# Patient Record
Sex: Female | Born: 1955
Health system: Southern US, Community
[De-identification: ages and names within clinical notes are randomized; demographics above are authoritative.]

## PROBLEM LIST (undated history)

## (undated) DIAGNOSIS — I10 Essential (primary) hypertension: Secondary | ICD-10-CM

## (undated) DIAGNOSIS — E785 Hyperlipidemia, unspecified: Secondary | ICD-10-CM

## (undated) DIAGNOSIS — G43909 Migraine, unspecified, not intractable, without status migrainosus: Secondary | ICD-10-CM

## (undated) DIAGNOSIS — A64 Unspecified sexually transmitted disease: Secondary | ICD-10-CM

## (undated) DIAGNOSIS — N189 Chronic kidney disease, unspecified: Secondary | ICD-10-CM

## (undated) DIAGNOSIS — K5900 Constipation, unspecified: Secondary | ICD-10-CM

## (undated) DIAGNOSIS — M1990 Unspecified osteoarthritis, unspecified site: Secondary | ICD-10-CM

## (undated) HISTORY — PX: SPINE SURGERY: SHX786

## (undated) HISTORY — DX: Hyperlipidemia, unspecified: E78.5

## (undated) HISTORY — DX: Essential (primary) hypertension: I10

## (undated) HISTORY — PX: LUMBAR DISC SURGERY: SHX700

## (undated) HISTORY — DX: Unspecified sexually transmitted disease: A64

## (undated) HISTORY — PX: STEROID INJECTION TO SCAR: SHX2447

## (undated) HISTORY — DX: Constipation, unspecified: K59.00

## (undated) HISTORY — DX: Migraine, unspecified, not intractable, without status migrainosus: G43.909

## (undated) HISTORY — PX: CHOLECYSTECTOMY: SHX55

## (undated) HISTORY — PX: BREAST CYST ASPIRATION: SHX578

## (undated) HISTORY — PX: TONSILLECTOMY: SUR1361

## (undated) HISTORY — DX: Unspecified osteoarthritis, unspecified site: M19.90

## (undated) HISTORY — PX: HERNIA REPAIR: SHX51

## (undated) HISTORY — DX: Chronic kidney disease, unspecified: N18.9

## (undated) HISTORY — PX: HEMORRHOID SURGERY: SHX153

## (undated) HISTORY — PX: ABDOMINAL HYSTERECTOMY: SHX81

---

## 1998-11-25 ENCOUNTER — Other Ambulatory Visit: Admission: RE | Admit: 1998-11-25 | Discharge: 1998-11-25 | Payer: Self-pay | Admitting: Gynecology

## 1999-06-07 ENCOUNTER — Other Ambulatory Visit: Admission: RE | Admit: 1999-06-07 | Discharge: 1999-06-07 | Payer: Self-pay | Admitting: Gynecology

## 2000-09-18 ENCOUNTER — Encounter: Payer: Self-pay | Admitting: Family Medicine

## 2000-09-18 ENCOUNTER — Encounter: Admission: RE | Admit: 2000-09-18 | Discharge: 2000-09-18 | Payer: Self-pay | Admitting: Family Medicine

## 2000-11-27 ENCOUNTER — Other Ambulatory Visit: Admission: RE | Admit: 2000-11-27 | Discharge: 2000-11-27 | Payer: Self-pay | Admitting: Gynecology

## 2000-12-12 ENCOUNTER — Encounter: Payer: Self-pay | Admitting: Gynecology

## 2000-12-12 ENCOUNTER — Encounter: Admission: RE | Admit: 2000-12-12 | Discharge: 2000-12-12 | Payer: Self-pay | Admitting: Gynecology

## 2000-12-24 ENCOUNTER — Encounter: Admission: RE | Admit: 2000-12-24 | Discharge: 2000-12-24 | Payer: Self-pay | Admitting: Gynecology

## 2000-12-24 ENCOUNTER — Encounter: Payer: Self-pay | Admitting: Gynecology

## 2001-11-27 ENCOUNTER — Other Ambulatory Visit: Admission: RE | Admit: 2001-11-27 | Discharge: 2001-11-27 | Payer: Self-pay | Admitting: Gynecology

## 2001-12-16 ENCOUNTER — Encounter: Payer: Self-pay | Admitting: Gynecology

## 2001-12-16 ENCOUNTER — Encounter: Admission: RE | Admit: 2001-12-16 | Discharge: 2001-12-16 | Payer: Self-pay | Admitting: Gynecology

## 2002-02-12 ENCOUNTER — Other Ambulatory Visit: Admission: RE | Admit: 2002-02-12 | Discharge: 2002-02-12 | Payer: Self-pay | Admitting: Gynecology

## 2002-12-02 ENCOUNTER — Other Ambulatory Visit: Admission: RE | Admit: 2002-12-02 | Discharge: 2002-12-02 | Payer: Self-pay | Admitting: Gynecology

## 2002-12-24 ENCOUNTER — Encounter: Admission: RE | Admit: 2002-12-24 | Discharge: 2002-12-24 | Payer: Self-pay | Admitting: Gynecology

## 2002-12-24 ENCOUNTER — Encounter: Payer: Self-pay | Admitting: Gynecology

## 2003-08-07 ENCOUNTER — Emergency Department (HOSPITAL_COMMUNITY): Admission: EM | Admit: 2003-08-07 | Discharge: 2003-08-07 | Payer: Self-pay | Admitting: Emergency Medicine

## 2003-12-08 ENCOUNTER — Other Ambulatory Visit: Admission: RE | Admit: 2003-12-08 | Discharge: 2003-12-08 | Payer: Self-pay | Admitting: Gynecology

## 2004-01-04 ENCOUNTER — Encounter: Admission: RE | Admit: 2004-01-04 | Discharge: 2004-01-04 | Payer: Self-pay | Admitting: Gynecology

## 2004-12-13 ENCOUNTER — Other Ambulatory Visit: Admission: RE | Admit: 2004-12-13 | Discharge: 2004-12-13 | Payer: Self-pay | Admitting: Gynecology

## 2005-01-17 ENCOUNTER — Encounter: Admission: RE | Admit: 2005-01-17 | Discharge: 2005-01-17 | Payer: Self-pay | Admitting: Gynecology

## 2005-09-14 ENCOUNTER — Encounter: Admission: RE | Admit: 2005-09-14 | Discharge: 2005-09-14 | Payer: Self-pay | Admitting: Gastroenterology

## 2005-10-30 ENCOUNTER — Encounter: Admission: RE | Admit: 2005-10-30 | Discharge: 2005-10-30 | Payer: Self-pay | Admitting: Gastroenterology

## 2005-12-26 ENCOUNTER — Other Ambulatory Visit: Admission: RE | Admit: 2005-12-26 | Discharge: 2005-12-26 | Payer: Self-pay | Admitting: Gynecology

## 2006-01-23 ENCOUNTER — Encounter: Admission: RE | Admit: 2006-01-23 | Discharge: 2006-01-23 | Payer: Self-pay | Admitting: Gynecology

## 2006-02-06 ENCOUNTER — Encounter: Admission: RE | Admit: 2006-02-06 | Discharge: 2006-02-06 | Payer: Self-pay | Admitting: Gynecology

## 2006-05-30 ENCOUNTER — Emergency Department (HOSPITAL_COMMUNITY): Admission: EM | Admit: 2006-05-30 | Discharge: 2006-05-30 | Payer: Self-pay | Admitting: Emergency Medicine

## 2006-07-16 ENCOUNTER — Encounter: Admission: RE | Admit: 2006-07-16 | Discharge: 2006-07-16 | Payer: Self-pay | Admitting: Gynecology

## 2006-07-31 ENCOUNTER — Encounter (INDEPENDENT_AMBULATORY_CARE_PROVIDER_SITE_OTHER): Payer: Self-pay | Admitting: *Deleted

## 2006-07-31 ENCOUNTER — Ambulatory Visit (HOSPITAL_COMMUNITY): Admission: RE | Admit: 2006-07-31 | Discharge: 2006-07-31 | Payer: Self-pay | Admitting: Surgery

## 2006-08-20 ENCOUNTER — Encounter: Admission: RE | Admit: 2006-08-20 | Discharge: 2006-08-20 | Payer: Self-pay | Admitting: Surgery

## 2006-08-28 ENCOUNTER — Encounter: Admission: RE | Admit: 2006-08-28 | Discharge: 2006-08-28 | Payer: Self-pay | Admitting: Surgery

## 2006-10-21 ENCOUNTER — Other Ambulatory Visit: Admission: RE | Admit: 2006-10-21 | Discharge: 2006-10-21 | Payer: Self-pay | Admitting: Surgery

## 2006-11-12 ENCOUNTER — Encounter: Admission: RE | Admit: 2006-11-12 | Discharge: 2006-11-12 | Payer: Self-pay | Admitting: Family Medicine

## 2006-12-10 ENCOUNTER — Ambulatory Visit (HOSPITAL_COMMUNITY): Admission: RE | Admit: 2006-12-10 | Discharge: 2006-12-11 | Payer: Self-pay | Admitting: Neurological Surgery

## 2007-01-14 ENCOUNTER — Other Ambulatory Visit: Admission: RE | Admit: 2007-01-14 | Discharge: 2007-01-14 | Payer: Self-pay | Admitting: Gynecology

## 2007-02-11 ENCOUNTER — Encounter: Admission: RE | Admit: 2007-02-11 | Discharge: 2007-02-11 | Payer: Self-pay | Admitting: Gynecology

## 2007-05-13 ENCOUNTER — Encounter: Admission: RE | Admit: 2007-05-13 | Discharge: 2007-05-13 | Payer: Self-pay | Admitting: Surgery

## 2007-11-06 ENCOUNTER — Encounter: Admission: RE | Admit: 2007-11-06 | Discharge: 2007-11-06 | Payer: Self-pay | Admitting: Surgery

## 2008-02-17 ENCOUNTER — Encounter: Admission: RE | Admit: 2008-02-17 | Discharge: 2008-02-17 | Payer: Self-pay | Admitting: Gynecology

## 2009-02-17 ENCOUNTER — Encounter: Admission: RE | Admit: 2009-02-17 | Discharge: 2009-02-17 | Payer: Self-pay | Admitting: Gynecology

## 2009-08-19 ENCOUNTER — Encounter: Admission: RE | Admit: 2009-08-19 | Discharge: 2009-08-19 | Payer: Self-pay | Admitting: Neurological Surgery

## 2010-03-07 ENCOUNTER — Encounter: Admission: RE | Admit: 2010-03-07 | Discharge: 2010-03-07 | Payer: Self-pay | Admitting: Gynecology

## 2010-07-30 ENCOUNTER — Encounter: Payer: Self-pay | Admitting: Gynecology

## 2010-11-21 NOTE — Op Note (Signed)
NAMETESSICA, Erin Peters            ACCOUNT NO.:  [id number]   MEDICAL RECORD NO.:  [id number]          PATIENT TYPE:  OIB   LOCATION:  5150                         FACILITY:  [facility]   PHYSICIAN:  Stefani Dama, M.D.  DATE OF BIRTH:  Jan 27, 1956   DATE OF PROCEDURE:  12/10/2006  DATE OF DISCHARGE:                               OPERATIVE REPORT   PREOPERATIVE DIAGNOSIS:  Herniated nucleus pulposus L5-S1 left with left  lumbar radiculopathy.   POSTOPERATIVE DIAGNOSIS:  Herniated nucleus pulposus L5-S1 left with  left lumbar radiculopathy.   PROCEDURE:  METRx discectomy L5-S1 left with the operating microscope  and microdissection technique.   SURGEON:  Stefani Dama, M.D.   FIRST ASSISTANT:  Clydene Fake, M.D.   ANESTHESIA:  General endotracheal.   INDICATIONS:  Erin Peters is a 55 year old individual who has had  significant back and left lower extremity pain for several months.  It  has slowly been getting worse despite the passage of time and efforts at  treatment with conventional conservative treatments.  She has an MRI  that demonstrates a herniated nucleus pulposus at the L5-S1 level on the  left. Having failed efforts at conservative management, and now  developing weakness in the left calf at the ankle, she has been advised  regarding surgical decompression.   DESCRIPTION OF PROCEDURE:  The patient was brought to the operating room  supine on the stretcher. After the smooth induction of general  endotracheal anesthesia, she was turned prone onto the operating table  with parallel rolls being placed under the back. The back was then  prepped with alcohol and DuraPrep and draped in a sterile fashion.  Fluoroscopic guidance was then used to localize the L5-S1 space and then  the skin above this area was infiltrated with a combination of lidocaine  1% with epinephrine and 0.5% Marcaine mixed 50/50, a total of 15 mL was  used.  A small incision was then made in  the skin overlying the area  chosen and a K-wire was passed down to the laminar arch of L5.  Then,  using a warming technique, a series of dilators was passed over the K-  wire to dilate this to the 18 mm size.  An 18 mm diameter x 5 cm deep  cannula was placed through the skin down to the laminar arch of L5. The  interlaminar space could be visualized with some soft tissues covering  it.  The operating microscope was draped and brought into the field and  then, using monopolar cautery, soft tissues were cleared to expose the  laminotomy site at the L5 and S1 space.  A laminotomy was then created  removing the inferior margin of the lamina of L5 out to the medial wall  of the facet and taking up the yellow ligament.  The underlying dura was  identified and the S1 nerve root was noted to be bowed dorsally and  laterally. By dissecting lateral to the S1 nerve root and mobilizing  medially, the underlying fragment of disc material was identified.  This  was a free fragment of disc and was  removed along with several other  smaller fragments of disc that were found behind the body of the S1  vertebra.  Further exploration yielded several other fragments.  The  area of the disc space was explored and no definitive tear or hole into  the disc space was identified. Some cautery was applied here to seal up  some epidural veins.  The distal catheter along the S1 nerve root was  explored down into the foramen. Several of the small fragments of disc  were encountered medially. No major fragments were found.  This area was  irrigated copiously with antibiotic irrigating solution. 80 mg of Depo-  Medrol along with 50 mcg of fentanyl was left in the epidural space.  The cannula was  removed and the subcutaneous tissue was closed with a single 3-0 Vicryl  suture.  3-0 Vicryl was used for subcuticular skin closure and Dermabond  was placed on the skin.  The patient tolerated the procedure well.  Blood  loss estimated at about 20 mL.      Stefani Dama, M.D.  Electronically Signed     HJE/MEDQ  D:  12/10/2006  T:  12/10/2006  Job:  [id number]

## 2010-11-24 NOTE — Op Note (Signed)
Erin Peters, CLERE            ACCOUNT NO.:  [id number]   MEDICAL RECORD NO.:  [id number]          PATIENT TYPE:  AMB   LOCATION:  DAY                          [facility]   PHYSICIAN:  Thomas A. Cornett, M.D.DATE OF BIRTH:  Feb 08, 1956   DATE OF PROCEDURE:  07/31/2006  DATE OF DISCHARGE:                               OPERATIVE REPORT   PREOPERATIVE DIAGNOSIS:  Symptomatic cholelithiasis.   POSTOPERATIVE DIAGNOSIS:  Symptomatic cholelithiasis.   PROCEDURE:  Laparoscopic cholecystectomy with intraoperative  cholangiogram.   SURGEON:  Thomas Cornett.   ASSISTANT:  Dr. Cicero Duck.   ANESTHESIA:  General endotracheal anesthesia with 0.5% Marcaine local  anesthesia.   ESTIMATED BLOOD LOSS:  20 mL.   SPECIMEN:  Gallbladder to pathology for evaluation.   DRAINS:  None.   INDICATIONS FOR PROCEDURE:  The patient is a 55 year old female who has  symptomatic cholelithiasis today.  She presents for laparoscopic  cholecystectomy for the above.  Procedure discussed with the patient  preoperatively as well as potential complications.  She voiced  understanding and agreed to proceed.   DESCRIPTION OF PROCEDURE:  The patient was brought to the operating room  and placed supine.  After induction of general anesthesia, the abdomen  was prepped and draped in a sterile fashion.  A 1-cm supraumbilical  incision was made, and dissection was carried down to her fascia.  Local  anesthesia was infiltrated here.  Incision was made in the fascia, and  the abdominal cavity was entered using a Kelly clamp to separate the  peritoneal lining.  Once the abdominal cavity was entered, a pursestring  suture of 0 Vicryl was placed, and a 12-mm Hassan cannula was placed  under direct vision.  Pneumoperitoneum was created to 15 mmHg of CO2,  and laparoscope was placed.  The patient was placed in reversed  Trendelenburg and rolled to her left.  Laparoscopy revealed no other  significant abnormality  in the abdomen.  The gallbladder was identified.  A 11-mm subxiphoid port was placed under direct vision.  Two 5-mm ports  were placed in the right mid abdomen under direct vision with local  anesthesia.  The dome of the gallbladder was identified, grasped and  retracted to the patient's right shoulder.  The gallbladder had some  chronic inflammatory changes grossly.  Next, the second grasper was used  to grab the infundibulum.  We pulled some adhesions away from the  gallbladder toward the duodenum to free up the infundibulum.  Once the  infundibulum was grasped, it was pulled towards the patient's right  lower quadrant.  I used the dissector to dissect up the cystic duct.  This was the only tubular structure entering the gallbladder.  A clip  was placed in the gallbladder side of this.  A small incision was made  in the cystic duct.  Through a separate stab incision, a Reddick  catheter was placed for intraoperative cholangiogram.  One half-strength  Hypaque dye was used.  Catheter was placed in the cystic duct and  controlled with clips.  Intraoperative cholangiogram was performed using  fluoroscopy.  There was free flow of  contrast down a twisted cystic  duct.  It flowed into a nondilated common duct, into the duodenum  without signs of stricture or obstruction.  There is free flow of  contrast up the common hepatic duct into its bifurcation, into right  left hepatic ducts without signs of stones, stricture or extravasation.  At this point in time, the cholangiogram was completed.  The clips were  removed.  The catheter was withdrawn, and the cystic duct stump was  triple clipped and divided.  I placed two additional clips in the  gallbladder side as well.  Once this was divided and the cystic artery  was identified, this was double clipped and divided.  Cautery was used  to dissect the gallbladder away from the gallbladder fossa without  difficulty with excellent hemostasis.  Once  this was done, the  gallbladder was placed in an EndoCatch bag.  The bed was inspected.  There was some oozing toward the base of the bed near the cystic artery.  There appeared to be a small branch of the posterior cystic artery and  was cauterized with some oozing but was not clipped.  I was able to get  a small clip across this without difficulty.  Great care was taken to  stay away from the common duct and not obstruct this.  This clip went on  quite nicely well away from vital structures.  At this point in time, I  reinspected the gallbladder bed and found it to be hemostatic.  We went  ahead and irrigated out the gallbladder fossa, and this was clear of no  signs of bile leakage either from the gallbladder bed or the cystic duct  stump.  Once this was suctioned out, we had flattened the patient out  and removed the gallbladder with the EndoCatch bag through the  umbilicus.  The umbilical incision was closed with previously placed  pursestring suture of 0 Vicryl.  Excess irrigation was suctioned out.  The 5-mm ports were removed without difficulty with no signs of port  site bleeding.  I removed the subxiphoid port.  All skin incisions were  closed with 4-0 Monocryl.  Steri-Strips and dry dressings were applied.  All final counts of sponge, needle and instruments were found be correct  at this portion of the case.  The patient was awoke and taken to  recovery in satisfactory condition.      Thomas A. Cornett, M.D.  Electronically Signed     TAC/MEDQ  D:  07/31/2006  T:  07/31/2006  Job:  [id number]

## 2011-02-12 ENCOUNTER — Other Ambulatory Visit: Payer: Self-pay | Admitting: Gynecology

## 2011-02-12 DIAGNOSIS — Z1231 Encounter for screening mammogram for malignant neoplasm of breast: Secondary | ICD-10-CM

## 2011-03-15 ENCOUNTER — Ambulatory Visit: Payer: Self-pay

## 2011-03-27 ENCOUNTER — Ambulatory Visit
Admission: RE | Admit: 2011-03-27 | Discharge: 2011-03-27 | Disposition: A | Discharge status: Home / Self Care | Payer: 59 | Source: Ambulatory Visit | Attending: Gynecology | Admitting: Gynecology

## 2011-03-27 DIAGNOSIS — Z1231 Encounter for screening mammogram for malignant neoplasm of breast: Secondary | ICD-10-CM

## 2011-05-23 ENCOUNTER — Other Ambulatory Visit: Payer: Self-pay | Admitting: Gynecology

## 2011-07-15 ENCOUNTER — Ambulatory Visit (INDEPENDENT_AMBULATORY_CARE_PROVIDER_SITE_OTHER): Payer: 59

## 2011-07-15 DIAGNOSIS — J040 Acute laryngitis: Secondary | ICD-10-CM

## 2011-07-15 DIAGNOSIS — R059 Cough, unspecified: Secondary | ICD-10-CM

## 2011-07-15 DIAGNOSIS — N390 Urinary tract infection, site not specified: Secondary | ICD-10-CM

## 2011-07-15 DIAGNOSIS — R05 Cough: Secondary | ICD-10-CM

## 2012-02-19 ENCOUNTER — Other Ambulatory Visit: Payer: Self-pay | Admitting: Gynecology

## 2012-02-19 DIAGNOSIS — Z1231 Encounter for screening mammogram for malignant neoplasm of breast: Secondary | ICD-10-CM

## 2012-04-01 ENCOUNTER — Ambulatory Visit
Admission: RE | Admit: 2012-04-01 | Discharge: 2012-04-01 | Disposition: A | Discharge status: Home / Self Care | Payer: 59 | Source: Ambulatory Visit | Attending: Gynecology | Admitting: Gynecology

## 2012-04-01 DIAGNOSIS — Z1231 Encounter for screening mammogram for malignant neoplasm of breast: Secondary | ICD-10-CM

## 2012-05-27 ENCOUNTER — Other Ambulatory Visit: Payer: Self-pay | Admitting: Gynecology

## 2013-02-27 ENCOUNTER — Other Ambulatory Visit: Payer: Self-pay

## 2013-04-01 ENCOUNTER — Ambulatory Visit: Payer: 59 | Admitting: Family Medicine

## 2013-05-25 ENCOUNTER — Other Ambulatory Visit: Payer: Self-pay

## 2013-05-25 DIAGNOSIS — Z1231 Encounter for screening mammogram for malignant neoplasm of breast: Secondary | ICD-10-CM

## 2013-06-10 ENCOUNTER — Encounter: Payer: Self-pay | Admitting: Family Medicine

## 2013-06-10 ENCOUNTER — Ambulatory Visit (INDEPENDENT_AMBULATORY_CARE_PROVIDER_SITE_OTHER): Payer: 59 | Admitting: Family Medicine

## 2013-06-10 VITALS — BP 118/86 | HR 87 | Temp 98.2°F | Resp 16 | Ht 67.0 in | Wt 170.4 lb

## 2013-06-10 DIAGNOSIS — Z01419 Encounter for gynecological examination (general) (routine) without abnormal findings: Secondary | ICD-10-CM

## 2013-06-10 DIAGNOSIS — E7800 Pure hypercholesterolemia, unspecified: Secondary | ICD-10-CM

## 2013-06-10 DIAGNOSIS — I10 Essential (primary) hypertension: Secondary | ICD-10-CM

## 2013-06-10 DIAGNOSIS — K5900 Constipation, unspecified: Secondary | ICD-10-CM

## 2013-06-10 DIAGNOSIS — R159 Full incontinence of feces: Secondary | ICD-10-CM

## 2013-06-10 DIAGNOSIS — R55 Syncope and collapse: Secondary | ICD-10-CM

## 2013-06-10 DIAGNOSIS — Z0000 Encounter for general adult medical examination without abnormal findings: Secondary | ICD-10-CM

## 2013-06-10 LAB — LIPID PANEL
LDL Cholesterol: 137 mg/dL — ABNORMAL HIGH (ref 0–99)
Triglycerides: 145 mg/dL (ref <150)
VLDL: 29 mg/dL (ref 0–40)

## 2013-06-10 LAB — POCT UA - MICROSCOPIC ONLY
Casts, Ur, LPF, POC: NEGATIVE
Crystals, Ur, HPF, POC: NEGATIVE
Mucus, UA: POSITIVE
Yeast, UA: NEGATIVE

## 2013-06-10 LAB — CBC
Hemoglobin: 14 g/dL (ref 12.0–15.0)
MCH: 30 pg (ref 26.0–34.0)
MCHC: 34.2 g/dL (ref 30.0–36.0)
Platelets: 277 10*3/uL (ref 150–400)
RDW: 13.4 % (ref 11.5–15.5)

## 2013-06-10 LAB — COMPREHENSIVE METABOLIC PANEL
ALT: 19 U/L (ref 0–35)
AST: 20 U/L (ref 0–37)
Albumin: 4.6 g/dL (ref 3.5–5.2)
Alkaline Phosphatase: 67 U/L (ref 39–117)
Potassium: 4.8 mEq/L (ref 3.5–5.3)
Sodium: 141 mEq/L (ref 135–145)
Total Bilirubin: 0.5 mg/dL (ref 0.3–1.2)
Total Protein: 7.7 g/dL (ref 6.0–8.3)

## 2013-06-10 LAB — POCT URINALYSIS DIPSTICK
Glucose, UA: NEGATIVE
Nitrite, UA: NEGATIVE
Protein, UA: NEGATIVE
Spec Grav, UA: 1.015
Urobilinogen, UA: 1

## 2013-06-10 MED ORDER — OLMESARTAN MEDOXOMIL-HCTZ 40-12.5 MG PO TABS: 1.0000 | ORAL_TABLET | Freq: Every day | ORAL | Status: DC

## 2013-06-10 MED ORDER — ESTRADIOL 0.1 MG/24HR TD PTTW: 1.0000 | MEDICATED_PATCH | TRANSDERMAL | Status: DC

## 2013-06-10 MED ORDER — ATORVASTATIN CALCIUM 10 MG PO TABS: 10.0000 mg | ORAL_TABLET | Freq: Every day | ORAL | Status: DC

## 2013-06-10 NOTE — Progress Notes (Signed)
Subjective:    Patient ID: Erin Peters, female    DOB: 1956/03/04, 57 y.o.   MRN: [id number]  HPI This 57 y.o. female presents to establish care and for CPE.  Last physical 05/2012.. Pap smear 05/2012. Abnormal pap smear 30 years ago.  [place]. Mammogram scheduled this month; [facility].  [place]. Colonoscopy 2007. Normal.  Repeat in 10 years.  TDAP 2006. Flu vaccine at work. Pneumovax. Zostavax never.   Eye exam 2014; +contacts; no g/c. Dental exam yearly.  Review of Systems See CMA ROS.  Past Medical History  Diagnosis Date  . Hypertension   . Chronic kidney disease   . Constipation   . Hyperlipidemia   . Arthritis     DDD lumbar   Past Surgical History  Procedure Laterality Date  . Cholecystectomy    . Hernia repair    . Lumbar disc surgery    . Abdominal hysterectomy      uterine prolapse; ovaries intact.  . Hemorrhoid surgery     No Known Allergies History   Social History  . Marital Status: Married    Spouse Name: N/A    Number of Children: N/A  . Years of Education: N/A   Occupational History  .  Unemployed    Payment processiong supervisor   Social History Main Topics  . Smoking status: Former Games developer  . Smokeless tobacco: Former Neurosurgeon  . Alcohol Use: No  . Drug Use: No  . Sexual Activity: Not on file   Other Topics Concern  . Not on file   Social History Narrative   Marital status: married x 38 years; happily; no abuse      Children: 2 children (daughter 86, son 35); grandchildren (1)      Lives:  With husband.      Employment:  [employer] x 14 years;payment processing;  happy moderate.      Tobacco: none; former smoker.      Exercise:  Walking/treadmill/elliptical/weights gym at [place].      Seatbelt:  100%      Guns: loaded and secured.               Family History  Problem Relation Age of Onset  . Diabetes Mother   . Heart disease Mother 71    stents x 5; AMI age 83.  Marland Kitchen Hyperlipidemia Mother   .  Hypertension Mother   . Heart disease Maternal Grandmother   . Heart disease Maternal Grandfather        Objective:   Physical Exam  Nursing note and vitals reviewed. Constitutional: She is oriented to person, place, and time. She appears well-developed and well-nourished. No distress.  HENT:  Head: Normocephalic and atraumatic.  Right Ear: External ear normal.  Left Ear: External ear normal.  Nose: Nose normal.  Mouth/Throat: Oropharynx is clear and moist.  Eyes: Conjunctivae and EOM are normal. Pupils are equal, round, and reactive to light.  Neck: Normal range of motion and full passive range of motion without pain. Neck supple. No JVD present. Carotid bruit is not present. No thyromegaly present.  Cardiovascular: Normal rate, regular rhythm and normal heart sounds.  Exam reveals no gallop and no friction rub.   No murmur heard. Pulmonary/Chest: Effort normal and breath sounds normal. She has no wheezes. She has no rales.  Abdominal: Soft. Bowel sounds are normal. She exhibits no distension and no mass. There is no tenderness. There is no rebound and no guarding.  Musculoskeletal:  Right shoulder: Normal.       Left shoulder: Normal.       Cervical back: Normal.  Lymphadenopathy:    She has no cervical adenopathy.  Neurological: She is alert and oriented to person, place, and time. She has normal reflexes. No cranial nerve deficit. She exhibits normal muscle tone. Coordination normal.  Skin: Skin is warm and dry. No rash noted. She is not diaphoretic. No erythema. No pallor.  Psychiatric: She has a normal mood and affect. Her behavior is normal. Judgment and thought content normal.       Assessment & Plan:  Routine general medical examination at a health care facility - Plan: CBC, Comprehensive metabolic panel, Lipid panel, TSH, POCT urinalysis dipstick, EKG 12-Lead, Hemoglobin A1c, POCT UA - Microscopic Only, IFOBT POC (occult bld, rslt in office), Pap IG (Image Guided),  CANCELED: Hemoglobin A1c  Essential hypertension, benign  Pure hypercholesterolemia  Unspecified constipation - Plan: Ambulatory referral to Gastroenterology  Vasomotor instability  Fecal incontinence - Plan: Ambulatory referral to Gastroenterology  1. Complete Physical Examination: anticipatory guidance provided --- exercise, weight loss. Pap smear obtained.  Refer for mammogram. Colonoscopy UTD. Immunizations UTD. 2. Gynecological exam: pap smear obtained; scheduled for mammogram this month; post-menopausal. 3.  HTN: controlld; obtain labs; refill provided; follow-up six months. 4.  Hypercholesterolemia: stable; obtain labs; refill provided. 5. Constipation with fecal incontinence: New. Refer to GI; colonoscopy UTD.   6.  Vasomotor instability: stable.  Refill provided.  Meds ordered this encounter  Medications  . DISCONTD: estradiol (MINIVELLE) 0.1 MG/24HR patch    Sig: Place 1 patch onto the skin 2 (two) times a week.  Marland Kitchen DISCONTD: olmesartan-hydrochlorothiazide (BENICAR HCT) 20-12.5 MG per tablet    Sig: Take 1 tablet by mouth daily.  Marland Kitchen DISCONTD: atorvastatin (LIPITOR) 10 MG tablet    Sig: Take 10 mg by mouth daily.  . Coenzyme Q10 (CO Q-10) 200 MG CAPS    Sig: Take by mouth.  . Glucosamine-Chondroitin (OSTEO BI-FLEX REGULAR STRENGTH PO)    Sig: Take by mouth.  . magnesium oxide (MAG-OX) 400 MG tablet    Sig: Take 400 mg by mouth daily.  . Cholecalciferol (D3 SUPER STRENGTH) 2000 UNITS CAPS    Sig: Take by mouth.  Marland Kitchen POTASSIUM GLUCONATE PO    Sig: Take 99 mg by mouth.  Marland Kitchen aspirin 325 MG tablet    Sig: Take 325 mg by mouth daily.  . Multiple Vitamin (MULTIVITAMIN) tablet    Sig: Take 1 tablet by mouth daily.  . Multiple Vitamins-Minerals (HAIR/SKIN/NAILS PO)    Sig: Take by mouth.  Marland Kitchen OVER THE COUNTER MEDICATION    Sig: Chlorofresh  . Polyethylene Glycol 3350 (MIRALAX PO)    Sig: Take by mouth.  . estradiol (ESTRACE) 0.1 MG/GM vaginal cream    Sig: Place 1  Applicatorful vaginally once a week.  Marland Kitchen DISCONTD: atorvastatin (LIPITOR) 10 MG tablet    Sig: Take 1 tablet (10 mg total) by mouth daily.    Dispense:  30 tablet    Refill:  11  . olmesartan-hydrochlorothiazide (BENICAR HCT) 40-12.5 MG per tablet    Sig: Take 1 tablet by mouth daily.    Dispense:  30 tablet    Refill:  11  . estradiol (MINIVELLE) 0.1 MG/24HR patch    Sig: Place 1 patch (0.1 mg total) onto the skin 2 (two) times a week.    Dispense:  8 patch    Refill:  11   Nilda Simmer, M.D.  Urgent  [facility] [address] [place], [place]  [address] [phone] phone [phone] fax

## 2013-06-10 NOTE — Progress Notes (Signed)
   Subjective:    Patient ID: Erin Peters, female    DOB: 23-Aug-1955, 57 y.o.   MRN: [id number]  HPI    Review of Systems  Constitutional: Negative.   HENT: Negative.   Eyes: Negative.   Respiratory: Negative.   Cardiovascular: Negative.   Gastrointestinal: Positive for abdominal pain and constipation.  Endocrine: Negative.   Genitourinary: Negative.   Musculoskeletal: Positive for back pain.  Skin: Negative.   Allergic/Immunologic: Negative.   Neurological: Negative.   Hematological: Negative.   Psychiatric/Behavioral: Negative.        Objective:   Physical Exam        Assessment & Plan:

## 2013-06-11 LAB — HEMOGLOBIN A1C
Hgb A1c MFr Bld: 5.8 % — ABNORMAL HIGH (ref <5.7)
Mean Plasma Glucose: 120 mg/dL — ABNORMAL HIGH (ref <117)

## 2013-06-11 LAB — PAP IG (IMAGE GUIDED)

## 2013-06-11 LAB — TSH: TSH: 0.749 u[IU]/mL (ref 0.350–4.500)

## 2013-06-15 ENCOUNTER — Encounter: Payer: Self-pay | Admitting: Gastroenterology

## 2013-06-25 ENCOUNTER — Ambulatory Visit
Admission: RE | Admit: 2013-06-25 | Discharge: 2013-06-25 | Disposition: A | Discharge status: Home / Self Care | Payer: 59 | Source: Ambulatory Visit

## 2013-06-25 DIAGNOSIS — Z1231 Encounter for screening mammogram for malignant neoplasm of breast: Secondary | ICD-10-CM

## 2013-07-15 ENCOUNTER — Ambulatory Visit (INDEPENDENT_AMBULATORY_CARE_PROVIDER_SITE_OTHER): Payer: 59 | Admitting: Gastroenterology

## 2013-07-15 ENCOUNTER — Encounter: Payer: Self-pay | Admitting: Gastroenterology

## 2013-07-15 VITALS — BP 112/74 | HR 87 | Ht 67.0 in | Wt 176.0 lb

## 2013-07-15 DIAGNOSIS — R141 Gas pain: Secondary | ICD-10-CM

## 2013-07-15 DIAGNOSIS — IMO0001 Reserved for inherently not codable concepts without codable children: Secondary | ICD-10-CM

## 2013-07-15 DIAGNOSIS — R143 Flatulence: Secondary | ICD-10-CM

## 2013-07-15 DIAGNOSIS — R142 Eructation: Secondary | ICD-10-CM

## 2013-07-15 DIAGNOSIS — K5900 Constipation, unspecified: Secondary | ICD-10-CM

## 2013-07-15 DIAGNOSIS — R159 Full incontinence of feces: Secondary | ICD-10-CM | POA: Insufficient documentation

## 2013-07-15 MED ORDER — PEG-KCL-NACL-NASULF-NA ASC-C 100 G PO SOLR: 1.0000 | Freq: Once | ORAL | Status: DC

## 2013-07-15 NOTE — Patient Instructions (Addendum)
It has been recommended to you by your physician that you have a Colonoscopy completed. Per your request, we did not schedule the procedure today. Please contact our office at [phone] should you decide to have the procedure completed.  Start on Align once daily which can be purchased over the counter.   You have been given a Low gas diet.   Thank you for choosing me and [facility].  Pricilla Riffle. Dagoberto Ligas., MD., Marval Regal  cc: Reginia Forts, MD

## 2013-07-15 NOTE — Progress Notes (Addendum)
    History of Present Illness: This is a 58 year old female with multiple complaints. She relates problems with chronic constipation for many years. Over the past year she has developed frequent fecal incontinence often when she thinks she is passing flatus. She states her stools are soft however she requires a laxative to move her bowels. She takes MiraLax every other day at this point. She relates chronic problems with intestinal gas and passing flatus. Flatus has been worsening over the past year or 2. She complains of bad breath and intermittent mouth sores. She was previously evaluated by Dr. Penelope Coop and she states she underwent colonoscopy in 2007 by Dr. Penelope Coop. She thinks the exam was normal. She underwent a small bowel series in 2007 that was normal. She underwent cholecystectomy 2008. Abdominal ultrasound performed in 05/2006 showed a 1 cm gallstone and mild common bile duct and pancreatic duct dilatation. She underwent lumbar spine surgery in 2007. A CBC, CMET, and TSH were normal in 06/2013. iFOBT was negative in 06/2013. Denies weight loss, abdominal pain, diarrhea, change in stool caliber, melena, hematochezia, nausea, vomiting, dysphagia, reflux symptoms, chest pain.  Review of Systems: Pertinent positive and negative review of systems were noted in the above HPI section. All other review of systems were otherwise negative.  Current Medications, Allergies, Past Medical History, Past Surgical History, Family History and Social History were reviewed in [employer] record.  Physical Exam: General: Well developed , well nourished, no acute distress Head: Normocephalic and atraumatic Eyes:  sclerae anicteric, EOMI Ears: Normal auditory acuity Mouth: No deformity or lesions Neck: Supple, no masses or thyromegaly Lungs: Clear throughout to auscultation Heart: Regular rate and rhythm; no murmurs, rubs or bruits Abdomen: Soft, non tender and non distended. No masses,  hepatosplenomegaly or hernias noted. Normal Bowel sounds Rectal: No lesions, normal sphincter tone, reasonable anal squeeze, Hemoccult negative very soft stool in the vault Musculoskeletal: Symmetrical with no gross deformities  Skin: No lesions on visible extremities Pulses:  Normal pulses noted Extremities: No clubbing, cyanosis, edema or deformities noted Neurological: Alert oriented x 4, grossly nonfocal Cervical Nodes:  No significant cervical adenopathy Inguinal Nodes: No significant inguinal adenopathy Psychological:  Alert and cooperative. Normal mood and affect  Assessment and Recommendations:  1. Chronic constipation and new fecal incontinence. I recommended proceeding with colonoscopy but she declined at this time. She states she has had 4 family members who've had complications from colonoscopies including 2 requiring hospitalization. Patient reports that she did not have any problems with her colonoscopy in 2007. Will request records from her prior colonoscopy. Begin a low gas diet, a daily probiotic the counter anti-gas medication. Return office visit in one month to reconsider colonoscopy and to consider referral to [facility] for incontinence evaluation.   2. Nonspecific mild dilation of the pancreatic duct in 2007.   07/27/2013. Record received and reviewed from Dr. Penelope Coop. Upper endoscopy performed 10/2005 showed small sessile gastric body polyps and gastric erythema. Biopsies revealed mild chronic gastritis and benign fundic gland polyps. Colonoscopy performed 10/2005 for a family history of colon polyps was normal.

## 2013-07-17 ENCOUNTER — Encounter: Payer: Self-pay | Admitting: Internal Medicine

## 2013-09-28 LAB — BASIC METABOLIC PANEL: CREATININE: 1.7 mg/dL — AB (ref <=1.1)

## 2013-12-23 ENCOUNTER — Ambulatory Visit: Payer: 59 | Admitting: Family Medicine

## 2013-12-30 ENCOUNTER — Ambulatory Visit (INDEPENDENT_AMBULATORY_CARE_PROVIDER_SITE_OTHER): Payer: 59 | Admitting: Family Medicine

## 2013-12-30 ENCOUNTER — Encounter: Payer: Self-pay | Admitting: Family Medicine

## 2013-12-30 VITALS — BP 140/86 | HR 73 | Temp 98.1°F | Resp 16 | Ht 67.0 in | Wt 184.8 lb

## 2013-12-30 DIAGNOSIS — I10 Essential (primary) hypertension: Secondary | ICD-10-CM

## 2013-12-30 DIAGNOSIS — M5137 Other intervertebral disc degeneration, lumbosacral region: Secondary | ICD-10-CM

## 2013-12-30 DIAGNOSIS — E7800 Pure hypercholesterolemia, unspecified: Secondary | ICD-10-CM

## 2013-12-30 DIAGNOSIS — N289 Disorder of kidney and ureter, unspecified: Secondary | ICD-10-CM

## 2013-12-30 DIAGNOSIS — M5136 Other intervertebral disc degeneration, lumbar region: Secondary | ICD-10-CM

## 2013-12-30 DIAGNOSIS — R159 Full incontinence of feces: Secondary | ICD-10-CM

## 2013-12-30 DIAGNOSIS — K5900 Constipation, unspecified: Secondary | ICD-10-CM

## 2013-12-30 LAB — LIPID PANEL
CHOL/HDL RATIO: 4 ratio
Cholesterol: 217 mg/dL — ABNORMAL HIGH (ref 0–200)
HDL: 54 mg/dL (ref >39)
LDL Cholesterol: 126 mg/dL — ABNORMAL HIGH (ref 0–99)
Triglycerides: 183 mg/dL — ABNORMAL HIGH (ref <150)
VLDL: 37 mg/dL (ref 0–40)

## 2013-12-30 LAB — COMPLETE METABOLIC PANEL WITH GFR
ALBUMIN: 4.8 g/dL (ref 3.5–5.2)
ALK PHOS: 64 U/L (ref 39–117)
ALT: 15 U/L (ref 0–35)
AST: 19 U/L (ref 0–37)
BUN: 15 mg/dL (ref 6–23)
CO2: 31 mEq/L (ref 19–32)
Calcium: 9.9 mg/dL (ref 8.4–10.5)
Chloride: 100 mEq/L (ref 96–112)
Creat: 0.7 mg/dL (ref 0.50–1.10)
GFR, Est African American: >89 mL/min
GFR, Est Non African American: >89 mL/min
Glucose, Bld: 83 mg/dL (ref 70–99)
POTASSIUM: 4.6 meq/L (ref 3.5–5.3)
SODIUM: 139 meq/L (ref 135–145)
TOTAL PROTEIN: 7.7 g/dL (ref 6.0–8.3)
Total Bilirubin: 0.5 mg/dL (ref 0.2–1.2)

## 2013-12-30 LAB — POCT URINALYSIS DIPSTICK
Blood, UA: NEGATIVE
Glucose, UA: NEGATIVE
Ketones, UA: NEGATIVE
LEUKOCYTES UA: NEGATIVE
Nitrite, UA: NEGATIVE
PH UA: 6.5
PROTEIN UA: NEGATIVE
UROBILINOGEN UA: 0.2

## 2013-12-30 LAB — CBC WITH DIFFERENTIAL/PLATELET
BASOS ABS: 0 10*3/uL (ref 0.0–0.1)
BASOS PCT: 0 % (ref 0–1)
Eosinophils Absolute: 0 10*3/uL (ref 0.0–0.7)
Eosinophils Relative: 0 % (ref 0–5)
HCT: 41.3 % (ref 36.0–46.0)
HEMOGLOBIN: 13.8 g/dL (ref 12.0–15.0)
Lymphocytes Relative: 36 % (ref 12–46)
Lymphs Abs: 3.1 10*3/uL (ref 0.7–4.0)
MCH: 29.2 pg (ref 26.0–34.0)
MCHC: 33.4 g/dL (ref 30.0–36.0)
MCV: 87.3 fL (ref 78.0–100.0)
MONOS PCT: 7 % (ref 3–12)
Monocytes Absolute: 0.6 10*3/uL (ref 0.1–1.0)
NEUTROS ABS: 4.8 10*3/uL (ref 1.7–7.7)
NEUTROS PCT: 57 % (ref 43–77)
PLATELETS: 273 10*3/uL (ref 150–400)
RBC: 4.73 MIL/uL (ref 3.87–5.11)
RDW: 13.3 % (ref 11.5–15.5)
WBC: 8.5 10*3/uL (ref 4.0–10.5)

## 2013-12-30 MED ORDER — ATORVASTATIN CALCIUM 20 MG PO TABS: 20.0000 mg | ORAL_TABLET | Freq: Every day | ORAL | Status: DC

## 2013-12-30 NOTE — Progress Notes (Signed)
Subjective:    Patient ID: Erin Peters, female    DOB: 1956-03-06, 58 y.o.   MRN: [id number]  12/30/2013  Follow-up, Hyperlipidemia and Hypertension   HPI This 58 y.o. female presents for six month follow-up of the following:  1. HTN: six month follow-up; no changes to management made at last visit.  Patient reports good compliance with medication, good tolerance to medication, and good symptom control.  Denies CP/palp/SOB/leg swelling.  Not exercising currently but plans to get a formal program soon.  Home BP readings running 100/72-124/82.  Has gained weight in past six months.  2. Hyperlipidemia: six month follow-up; no changes to management made at last visit but readings were elevated.   Had repeat cholesterol at work 11/2013; readings were still borderline elevated; will fax results to office.  Agreeable to increasing dose of medication.  Denies HA/dizziness/focal weakness/paresthesias.    3. Health Maintenance: s/p CPE at visit six months ago.  pap smear WNL 06/10/13.  Mammogram normal 05/25/13. Colonoscopy UTD 10/2005.    4. Constipation with fecal incontinence: s/p consultation by Dr. Fuller Plan 07/15/13; recommended colonoscopy but patient declined.  Fecal incontinence has resolved.  Constipation is well controlled with daily Miralax.  5.  DDD lumbar: s/p injection in lower back; unable to exercise.  Got really weak with lower back pain.  Injection by [facility].    6.  Obesity: weight has gone up.  Tried [employer].  Rare fried foods. Loves sweets.  Drinking less than 16 ounces of soda per day. No skipping.  7. Elevated Creatinine: prior to MRI for lumbar spine, Creatinine obtained and was elevated at 1.7.   Review of Systems  Constitutional: Negative for fever, chills, diaphoresis, activity change, appetite change and fatigue.  Respiratory: Negative for cough, shortness of breath and stridor.   Cardiovascular: Negative for chest pain, palpitations and leg swelling.  Gastrointestinal:  Positive for constipation. Negative for nausea, vomiting, abdominal pain, diarrhea and anal bleeding.  Musculoskeletal: Positive for back pain.  Neurological: Negative for dizziness, tremors, seizures, syncope, facial asymmetry, speech difficulty, weakness, light-headedness, numbness and headaches.    Past Medical History  Diagnosis Date  . Hypertension   . Chronic kidney disease   . Constipation   . Hyperlipidemia   . Arthritis     DDD lumbar   Past Surgical History  Procedure Laterality Date  . Cholecystectomy    . Hernia repair    . Lumbar disc surgery    . Abdominal hysterectomy      uterine prolapse; ovaries intact.  . Hemorrhoid surgery      No Known Allergies Current Outpatient Prescriptions  Medication Sig Dispense Refill  . aspirin 325 MG tablet Take 325 mg by mouth daily.      Marland Kitchen atorvastatin (LIPITOR) 20 MG tablet Take 1 tablet (20 mg total) by mouth daily.  30 tablet  11  . Biotin 1 MG CAPS Take 1 capsule by mouth daily.      . Cholecalciferol (D3 SUPER STRENGTH) 2000 UNITS CAPS Take by mouth.      . Coenzyme Q10 (CO Q-10) 200 MG CAPS Take by mouth.      . estradiol (ESTRACE) 0.1 MG/GM vaginal cream Place 1 Applicatorful vaginally once a week.      . estradiol (MINIVELLE) 0.1 MG/24HR patch Place 1 patch (0.1 mg total) onto the skin 2 (two) times a week.  8 patch  11  . etodolac (LODINE) 300 MG capsule       . Glucosamine-Chondroitin (OSTEO  BI-FLEX REGULAR STRENGTH PO) Take by mouth.      Marland Kitchen HYDROcodone-acetaminophen (NORCO/VICODIN) 5-325 MG per tablet       . magnesium oxide (MAG-OX) 400 MG tablet Take 400 mg by mouth daily.      . Multiple Vitamin (MULTIVITAMIN) tablet Take 1 tablet by mouth daily.      . Multiple Vitamins-Minerals (HAIR/SKIN/NAILS PO) Take by mouth.      . olmesartan-hydrochlorothiazide (BENICAR HCT) 40-12.5 MG per tablet Take 1 tablet by mouth daily.  30 tablet  11  . peg 3350 powder (MOVIPREP) 100 G SOLR Take 1 kit (200 g total) by mouth once.   1 kit  0  . Polyethylene Glycol 3350 (MIRALAX PO) Take by mouth.      Marland Kitchen POTASSIUM GLUCONATE PO Take 99 mg by mouth.      Marland Kitchen OVER THE COUNTER MEDICATION Chlorofresh       No current facility-administered medications for this visit.   History   Social History  . Marital Status: Married    Spouse Name: N/A    Number of Children: N/A  . Years of Education: N/A   Occupational History  .  Unemployed    Payment processiong supervisor   Social History Main Topics  . Smoking status: Former Research scientist (life sciences)  . Smokeless tobacco: Former Systems developer  . Alcohol Use: No  . Drug Use: No  . Sexual Activity: Not on file   Other Topics Concern  . Not on file   Social History Narrative   Marital status: married x 38 years; happily; no abuse      Children: 2 children (daughter 50, son 45); grandchildren (38)      Lives:  With husband.      Employment:  [employer] x 14 years;payment processing;  happy moderate.      Tobacco: none; former smoker.      Exercise:  Walking/treadmill/elliptical/weights gym at [employer].      Seatbelt:  100%      Guns: loaded and secured.                   Objective:    BP 140/86  Pulse 73  Temp(Src) 98.1 F (36.7 C) (Oral)  Resp 16  Ht _0  (1.702 m)  Wt 184 lb 12.8 oz (83.825 kg)  BMI 28.94 kg/m2  SpO2 97% Physical Exam  Constitutional: She is oriented to person, place, and time. She appears well-developed and well-nourished. No distress.  HENT:  Head: Normocephalic and atraumatic.  Right Ear: External ear normal.  Left Ear: External ear normal.  Nose: Nose normal.  Mouth/Throat: Oropharynx is clear and moist.  Eyes: Conjunctivae and EOM are normal. Pupils are equal, round, and reactive to light.  Neck: Normal range of motion. Neck supple. Carotid bruit is not present. No thyromegaly present.  Cardiovascular: Normal rate, regular rhythm, normal heart sounds and intact distal pulses.  Exam reveals no gallop and no friction rub.   No murmur  heard. Pulmonary/Chest: Effort normal and breath sounds normal. She has no wheezes. She has no rales.  Abdominal: Soft. Bowel sounds are normal. She exhibits no distension and no mass. There is no tenderness. There is no rebound and no guarding.  Lymphadenopathy:    She has no cervical adenopathy.  Neurological: She is alert and oriented to person, place, and time. No cranial nerve deficit.  Skin: Skin is warm and dry. No rash noted. She is not diaphoretic. No erythema. No pallor.  Psychiatric: She has  a normal mood and affect. Her behavior is normal.   Results for orders placed in visit on 12/30/13  CBC WITH DIFFERENTIAL      Result Value Ref Range   WBC 8.5  4.0 - 10.5 K/uL   RBC 4.73  3.87 - 5.11 MIL/uL   Hemoglobin 13.8  12.0 - 15.0 g/dL   HCT 41.3  36.0 - 46.0 %   MCV 87.3  78.0 - 100.0 fL   MCH 29.2  26.0 - 34.0 pg   MCHC 33.4  30.0 - 36.0 g/dL   RDW 13.3  11.5 - 15.5 %   Platelets 273  150 - 400 K/uL   Neutrophils Relative % 57  43 - 77 %   Neutro Abs 4.8  1.7 - 7.7 K/uL   Lymphocytes Relative 36  12 - 46 %   Lymphs Abs 3.1  0.7 - 4.0 K/uL   Monocytes Relative 7  3 - 12 %   Monocytes Absolute 0.6  0.1 - 1.0 K/uL   Eosinophils Relative 0  0 - 5 %   Eosinophils Absolute 0.0  0.0 - 0.7 K/uL   Basophils Relative 0  0 - 1 %   Basophils Absolute 0.0  0.0 - 0.1 K/uL   Smear Review Criteria for review not met    COMPLETE METABOLIC PANEL WITH GFR      Result Value Ref Range   Sodium 139  135 - 145 mEq/L   Potassium 4.6  3.5 - 5.3 mEq/L   Chloride 100  96 - 112 mEq/L   CO2 31  19 - 32 mEq/L   Glucose, Bld 83  70 - 99 mg/dL   BUN 15  6 - 23 mg/dL   Creat 0.70  0.50 - 1.10 mg/dL   Total Bilirubin 0.5  0.2 - 1.2 mg/dL   Alkaline Phosphatase 64  39 - 117 U/L   AST 19  0 - 37 U/L   ALT 15  0 - 35 U/L   Total Protein 7.7  6.0 - 8.3 g/dL   Albumin 4.8  3.5 - 5.2 g/dL   Calcium 9.9  8.4 - 10.5 mg/dL   GFR, Est African American >89     GFR, Est Non African American >89    LIPID  PANEL      Result Value Ref Range   Cholesterol 217 (*) 0 - 200 mg/dL   Triglycerides 183 (*) <150 mg/dL   HDL 54  >39 mg/dL   Total CHOL/HDL Ratio 4.0     VLDL 37  0 - 40 mg/dL   LDL Cholesterol 126 (*) 0 - 99 mg/dL  POCT URINALYSIS DIPSTICK      Result Value Ref Range   Color, UA Yellow     Clarity, UA Clear     Glucose, UA negative     Bilirubin, UA small     Ketones, UA Negative     Spec Grav, UA <=1.005     Blood, UA Negative     pH, UA 6.5     Protein, UA Negative     Urobilinogen, UA 0.2     Nitrite, UA Negative     Leukocytes, UA Negative         Assessment & Plan:  Essential hypertension, benign - Plan: CBC with Differential, COMPLETE METABOLIC PANEL WITH GFR, POCT urinalysis dipstick  Pure hypercholesterolemia - Plan: Lipid panel  Unspecified constipation  Renal insufficiency - Plan: POCT urinalysis dipstick  Fecal incontinence  Degenerative disc  disease, lumbar  1.  HTN: controlled; no changes to management; obtain labs.   2.  Hypercholesterolemia: uncontrolled; increase Lipitor to 17m daily; rx provided. 3.  Constipation: improved fecal incontinence with improved constipation; continue daily Miralax; s/p GI consultation; colonoscopy UTD in 2007. 4.  Acute renal insufficiency:  New.  Repeat normal and normal u/a. 5.  Fecal incontinence: improved and nearly resolved; s/p GI consultation.  [facility].  DDD lumbar:  With recent worsening; s/p MRI; s/p injection with improvement.  Meds ordered this encounter  Medications  . etodolac (LODINE) 300 MG capsule    Sig:   . HYDROcodone-acetaminophen (NORCO/VICODIN) 5-325 MG per tablet    Sig:   . atorvastatin (LIPITOR) 20 MG tablet    Sig: Take 1 tablet (20 mg total) by mouth daily.    Dispense:  30 tablet    Refill:  11    Return in about 6 months (around 07/01/2014) for complete physical examiniation.   KReginia Forts M.D.  [facility]  [phone]

## 2013-12-31 ENCOUNTER — Encounter: Payer: Self-pay | Admitting: Family Medicine

## 2014-02-18 ENCOUNTER — Telehealth: Payer: Self-pay

## 2014-02-18 MED ORDER — ATORVASTATIN CALCIUM 20 MG PO TABS: 20.0000 mg | ORAL_TABLET | Freq: Every day | ORAL | Status: DC

## 2014-02-18 NOTE — Telephone Encounter (Signed)
Patient went to get her lipitor and it is still recorded at 10 mg.   on June 24 the following was sent.    Marland Kitchen atorvastatin (LIPITOR) 20 MG tablet  Take 1 tablet (20 mg total) by mouth daily., Starting 12/30/2013, Until Discontinued, Normal, Last Dose: Not Recorded  Refills: 11 ordered [facility]: [id number] [place], [place].  Please call pharmacy and have them correct this for patient.  [phone]

## 2014-02-18 NOTE — Telephone Encounter (Signed)
Sent pt lipitor in to the pharmacy corrected script. Lm-Pt notified.

## 2014-03-08 ENCOUNTER — Ambulatory Visit (INDEPENDENT_AMBULATORY_CARE_PROVIDER_SITE_OTHER): Payer: 59 | Admitting: Internal Medicine

## 2014-03-08 VITALS — BP 124/86 | HR 88 | Temp 98.4°F | Resp 18 | Ht 67.0 in | Wt 189.0 lb

## 2014-03-08 DIAGNOSIS — J029 Acute pharyngitis, unspecified: Secondary | ICD-10-CM

## 2014-03-08 DIAGNOSIS — J208 Acute bronchitis due to other specified organisms: Secondary | ICD-10-CM

## 2014-03-08 DIAGNOSIS — J209 Acute bronchitis, unspecified: Secondary | ICD-10-CM

## 2014-03-08 MED ORDER — AZITHROMYCIN 500 MG PO TABS: 500.0000 mg | ORAL_TABLET | Freq: Every day | ORAL | Status: DC

## 2014-03-08 MED ORDER — BENZONATATE 200 MG PO CAPS: 200.0000 mg | ORAL_CAPSULE | Freq: Two times a day (BID) | ORAL | Status: DC | PRN

## 2014-03-08 NOTE — Progress Notes (Signed)
   Subjective:    Patient ID: Erin Peters, female    DOB: 1956-04-18, 58 y.o.   MRN: [id number]  HPI 58 year old female with  CC cough, sore throat, ear ache, sinus drainage hoarse voice, sneezing, cills.  HPI onset 3 days ago with increase in cough, moderate in severity sore throat, cough is keeping her up at night now with green sputum. Pt is a non smoker, no seasonal allergies, no hx of pneumonia. No cp no sob no fever    Review of Systems  Constitutional: Positive for chills. Negative for fever.  HENT: Positive for congestion, ear pain and sinus pressure. Negative for ear discharge, mouth sores and nosebleeds.   Eyes: Negative.   Respiratory: Positive for cough.   Cardiovascular: Negative.   Gastrointestinal: Negative.   Endocrine: Negative.   Genitourinary: Negative.   Musculoskeletal: Negative.   Skin: Negative.   Allergic/Immunologic: Negative.   Neurological: Negative.   Hematological: Negative.   Psychiatric/Behavioral: Negative.   All other systems reviewed and are negative.      Objective:   Physical Exam  Nursing note and vitals reviewed. Constitutional: She is oriented to person, place, and time. She appears well-developed and well-nourished.  HENT:  Head: Normocephalic and atraumatic.  Injected pharynx with no exudate, ears tm dull on the left and the right  Eyes: Conjunctivae and EOM are normal. Pupils are equal, round, and reactive to light.  Neck: Normal range of motion. Neck supple.  Cardiovascular: Normal rate, regular rhythm, normal heart sounds and intact distal pulses.   Pulmonary/Chest: Effort normal and breath sounds normal. She has no wheezes. She has no rales.  Abdominal: Soft. Bowel sounds are normal.  Musculoskeletal: Normal range of motion.  Neurological: She is alert and oriented to person, place, and time. She has normal reflexes.  Skin: Skin is warm and dry.  Psychiatric: She has a normal mood and affect. Her behavior is normal.  Judgment and thought content normal.          Assessment & Plan:  58 year old non smoker with cough st earache consistent with bronchitis and pharyngitis will treat with antibiotics and antitussives.

## 2014-03-08 NOTE — Patient Instructions (Signed)
Take meds as directed. Increase fluids. If your symptoms worsen return tot the office

## 2014-05-28 ENCOUNTER — Other Ambulatory Visit: Payer: Self-pay

## 2014-05-28 DIAGNOSIS — Z1231 Encounter for screening mammogram for malignant neoplasm of breast: Secondary | ICD-10-CM

## 2014-06-12 ENCOUNTER — Other Ambulatory Visit: Payer: Self-pay | Admitting: Family Medicine

## 2014-06-16 ENCOUNTER — Ambulatory Visit (INDEPENDENT_AMBULATORY_CARE_PROVIDER_SITE_OTHER): Payer: 59 | Admitting: Family Medicine

## 2014-06-16 ENCOUNTER — Encounter: Payer: Self-pay | Admitting: Family Medicine

## 2014-06-16 VITALS — BP 106/76 | HR 80 | Temp 98.3°F | Resp 16 | Ht 67.0 in | Wt 184.4 lb

## 2014-06-16 DIAGNOSIS — Z131 Encounter for screening for diabetes mellitus: Secondary | ICD-10-CM

## 2014-06-16 DIAGNOSIS — I10 Essential (primary) hypertension: Secondary | ICD-10-CM

## 2014-06-16 DIAGNOSIS — G4700 Insomnia, unspecified: Secondary | ICD-10-CM

## 2014-06-16 DIAGNOSIS — R0600 Dyspnea, unspecified: Secondary | ICD-10-CM

## 2014-06-16 DIAGNOSIS — Z0000 Encounter for general adult medical examination without abnormal findings: Secondary | ICD-10-CM

## 2014-06-16 DIAGNOSIS — E785 Hyperlipidemia, unspecified: Secondary | ICD-10-CM

## 2014-06-16 LAB — POCT URINALYSIS DIPSTICK
Blood, UA: NEGATIVE
GLUCOSE UA: NEGATIVE
KETONES UA: NEGATIVE
LEUKOCYTES UA: NEGATIVE
NITRITE UA: NEGATIVE
PH UA: 8.5
Spec Grav, UA: 1.015
UROBILINOGEN UA: 0.2

## 2014-06-16 LAB — CBC WITH DIFFERENTIAL/PLATELET
BASOS ABS: 0 10*3/uL (ref 0.0–0.1)
BASOS PCT: 0 % (ref 0–1)
EOS ABS: 0.1 10*3/uL (ref 0.0–0.7)
EOS PCT: 1 % (ref 0–5)
HCT: 42.2 % (ref 36.0–46.0)
HEMOGLOBIN: 14.7 g/dL (ref 12.0–15.0)
LYMPHS ABS: 1.9 10*3/uL (ref 0.7–4.0)
Lymphocytes Relative: 35 % (ref 12–46)
MCH: 30 pg (ref 26.0–34.0)
MCHC: 34.8 g/dL (ref 30.0–36.0)
MCV: 86.1 fL (ref 78.0–100.0)
MONOS PCT: 7 % (ref 3–12)
MPV: 9.4 fL (ref 9.4–12.4)
Monocytes Absolute: 0.4 10*3/uL (ref 0.1–1.0)
NEUTROS PCT: 57 % (ref 43–77)
Neutro Abs: 3 10*3/uL (ref 1.7–7.7)
PLATELETS: 311 10*3/uL (ref 150–400)
RBC: 4.9 MIL/uL (ref 3.87–5.11)
RDW: 12.9 % (ref 11.5–15.5)
WBC: 5.3 10*3/uL (ref 4.0–10.5)

## 2014-06-16 LAB — HEMOGLOBIN A1C
HEMOGLOBIN A1C: 5.9 % — AB (ref <5.7)
Mean Plasma Glucose: 123 mg/dL — ABNORMAL HIGH (ref <117)

## 2014-06-16 LAB — LIPID PANEL
Cholesterol: 167 mg/dL (ref 0–200)
HDL: 39 mg/dL — AB (ref >39)
LDL CALC: 87 mg/dL (ref 0–99)
Total CHOL/HDL Ratio: 4.3 Ratio
Triglycerides: 203 mg/dL — ABNORMAL HIGH (ref <150)
VLDL: 41 mg/dL — ABNORMAL HIGH (ref 0–40)

## 2014-06-16 LAB — COMPREHENSIVE METABOLIC PANEL
ALT: 17 U/L (ref 0–35)
AST: 21 U/L (ref 0–37)
Albumin: 4.8 g/dL (ref 3.5–5.2)
Alkaline Phosphatase: 70 U/L (ref 39–117)
BUN: 14 mg/dL (ref 6–23)
CALCIUM: 10.2 mg/dL (ref 8.4–10.5)
CO2: 28 meq/L (ref 19–32)
CREATININE: 0.7 mg/dL (ref 0.50–1.10)
Chloride: 102 mEq/L (ref 96–112)
Glucose, Bld: 100 mg/dL — ABNORMAL HIGH (ref 70–99)
Potassium: 5 mEq/L (ref 3.5–5.3)
Sodium: 139 mEq/L (ref 135–145)
Total Bilirubin: 0.7 mg/dL (ref 0.2–1.2)
Total Protein: 8 g/dL (ref 6.0–8.3)

## 2014-06-16 LAB — TSH: TSH: 0.318 u[IU]/mL — ABNORMAL LOW (ref 0.350–4.500)

## 2014-06-16 MED ORDER — TRAZODONE HCL 50 MG PO TABS: 50.0000 mg | ORAL_TABLET | Freq: Every evening | ORAL | Status: DC | PRN

## 2014-06-16 NOTE — Patient Instructions (Signed)

## 2014-06-16 NOTE — Progress Notes (Addendum)
Subjective:    Patient ID: Erin Peters, female    DOB: 08-Sep-1955, 58 y.o.   MRN: [id number]  06/16/2014  Annual Exam   HPI This 58 y.o. female presents for Complete Physical Examination.  Last physical:  06/10/2013 Pap smear:06/10/2013 Mammogram:  06/26/2013; scheduled 07/01/2014. Colonoscopy:  2007 Bone density:  2007 WNL TDAP:  2006 Pneumovax: never Zostavax: never Influenza: at work; 04/2014 Eye exam:  Every year; no g/c; + glasses or contacts.   Dental exam:  Every six months.   DOE: onset one month ago.  No chest pain, palpitations, diaphoresis, leg swelling, orthopnea.   Review of Systems  Constitutional: Negative for fever, chills, diaphoresis, activity change, appetite change, fatigue and unexpected weight change.  HENT: Negative for congestion, dental problem, drooling, ear discharge, ear pain, facial swelling, hearing loss, mouth sores, nosebleeds, postnasal drip, rhinorrhea, sinus pressure, sneezing, sore throat, tinnitus, trouble swallowing and voice change.   Eyes: Negative for photophobia, pain, discharge, redness, itching and visual disturbance.  Respiratory: Positive for shortness of breath. Negative for apnea, cough, choking, chest tightness, wheezing and stridor.   Cardiovascular: Negative for chest pain, palpitations and leg swelling.  Gastrointestinal: Negative for nausea, vomiting, abdominal pain, diarrhea, constipation, blood in stool, abdominal distention, anal bleeding and rectal pain.  Endocrine: Negative for cold intolerance, heat intolerance, polydipsia, polyphagia and polyuria.  Genitourinary: Negative for dysuria, urgency, frequency, hematuria, flank pain, decreased urine volume, vaginal bleeding, vaginal discharge, enuresis, difficulty urinating, genital sores, vaginal pain, menstrual problem, pelvic pain and dyspareunia.  Musculoskeletal: Positive for back pain and arthralgias. Negative for myalgias, joint swelling, gait problem, neck pain  and neck stiffness.  Skin: Negative for color change, pallor, rash and wound.  Allergic/Immunologic: Positive for environmental allergies. Negative for food allergies and immunocompromised state.  Neurological: Negative for dizziness, tremors, seizures, syncope, facial asymmetry, speech difficulty, weakness, light-headedness, numbness and headaches.  Hematological: Negative for adenopathy. Does not bruise/bleed easily.  Psychiatric/Behavioral: Negative for suicidal ideas, hallucinations, behavioral problems, confusion, sleep disturbance, self-injury, dysphoric mood, decreased concentration and agitation. The patient is not nervous/anxious and is not hyperactive.     Past Medical History  Diagnosis Date  . Hypertension   . Chronic kidney disease   . Constipation   . Hyperlipidemia   . Arthritis     DDD lumbar   Past Surgical History  Procedure Laterality Date  . Cholecystectomy    . Hernia repair    . Lumbar disc surgery    . Abdominal hysterectomy      uterine prolapse; ovaries intact.  . Hemorrhoid surgery     No Known Allergies Current Outpatient Prescriptions  Medication Sig Dispense Refill  . atorvastatin (LIPITOR) 20 MG tablet Take 1 tablet (20 mg total) by mouth daily. 30 tablet 11  . Biotin 1 MG CAPS Take 1 capsule by mouth daily.    . Cholecalciferol (D3 SUPER STRENGTH) 2000 UNITS CAPS Take by mouth.    . Coenzyme Q10 (CO Q-10) 200 MG CAPS Take by mouth.    . etodolac (LODINE) 300 MG capsule     . Glucosamine-Chondroitin (OSTEO BI-FLEX REGULAR STRENGTH PO) Take by mouth.    . magnesium oxide (MAG-OX) 400 MG tablet Take 400 mg by mouth daily.    . Multiple Vitamin (MULTIVITAMIN) tablet Take 1 tablet by mouth daily.    . peg 3350 powder (MOVIPREP) 100 G SOLR Take 1 kit (200 g total) by mouth once. 1 kit 0  . Polyethylene Glycol 3350 (MIRALAX PO) Take by  mouth.    Marland Kitchen POTASSIUM GLUCONATE PO Take 99 mg by mouth.    . estradiol (ESTRACE) 0.1 MG/GM vaginal cream Place 1  Applicatorful vaginally once a week. 42.5 g 11  . HYDROcodone-acetaminophen (NORCO/VICODIN) 5-325 MG per tablet     . Multiple Vitamins-Minerals (HAIR/SKIN/NAILS PO) Take by mouth.    . olmesartan-hydrochlorothiazide (BENICAR HCT) 40-12.5 MG per tablet Take 1 tablet by mouth daily. 30 tablet 4  . traZODone (DESYREL) 50 MG tablet Take 1-2 tablets (50-100 mg total) by mouth at bedtime as needed for sleep. 45 tablet 3  . VIVELLE-DOT 0.1 MG/24HR patch PLACE 1 PATCH (0.1 MG TOTAL) ONTO THE SKIN 2 (TWO) TIMES A WEEK. 8 patch 11   No current facility-administered medications for this visit.   History   Social History  . Marital Status: Married    Spouse Name: N/A    Number of Children: N/A  . Years of Education: N/A   Occupational History  .  Unemployed    Payment processiong supervisor   Social History Main Topics  . Smoking status: Former Research scientist (life sciences)  . Smokeless tobacco: Former Systems developer  . Alcohol Use: No  . Drug Use: No  . Sexual Activity: Yes   Other Topics Concern  . Not on file   Social History Narrative   Marital status: married x 39 years; happily; no abuse      Children: 2 children (daughter 3, son 16); grandchildrens (2)      Lives:  With husband.      Employment:  [employer] x 14 years;payment processing;  happy moderate.      Tobacco: none; former smoker.      Alcohol: none; rare      Exercise:  Walking/treadmill/elliptical/weights gym at [place].  Goal is 5 days per week; usually 3 days per week.      Seatbelt:  100%      Guns: loaded and secured.               Family History  Problem Relation Age of Onset  . Diabetes Mother   . Heart disease Mother 13    stents x 5; AMI age 62.  Marland Kitchen Hyperlipidemia Mother   . Hypertension Mother   . Dementia Mother   . Heart disease Maternal Grandmother   . Heart disease Maternal Grandfather        Objective:    BP 106/76 mmHg  Pulse 80  Temp(Src) 98.3 F (36.8 C) (Oral)  Resp 16  Ht '5\' 7"'  (1.702 m)  Wt 184 lb 6.4  oz (83.643 kg)  BMI 28.87 kg/m2  SpO2 98% Physical Exam  Constitutional: She is oriented to person, place, and time. She appears well-developed and well-nourished. No distress.  HENT:  Head: Normocephalic and atraumatic.  Right Ear: External ear normal.  Left Ear: External ear normal.  Nose: Nose normal.  Mouth/Throat: Oropharynx is clear and moist.  Eyes: Conjunctivae and EOM are normal. Pupils are equal, round, and reactive to light.  Neck: Normal range of motion and full passive range of motion without pain. Neck supple. No JVD present. Carotid bruit is not present. No thyromegaly present.  Cardiovascular: Normal rate, regular rhythm and normal heart sounds.  Exam reveals no gallop and no friction rub.   No murmur heard. Pulmonary/Chest: Effort normal and breath sounds normal. She has no wheezes. She has no rales. Right breast exhibits no inverted nipple, no mass, no nipple discharge, no skin change and no tenderness. Left breast exhibits  no inverted nipple, no mass, no nipple discharge, no skin change and no tenderness. Breasts are symmetrical.  Abdominal: Soft. Bowel sounds are normal. She exhibits no distension and no mass. There is no tenderness. There is no rebound and no guarding.  Genitourinary: Vagina normal. There is no rash, tenderness or lesion on the right labia. There is no rash, tenderness or lesion on the left labia. Right adnexum displays no mass, no tenderness and no fullness. Left adnexum displays no mass, no tenderness and no fullness.  Musculoskeletal:       Right shoulder: Normal.       Left shoulder: Normal.       Cervical back: Normal.  Lymphadenopathy:    She has no cervical adenopathy.  Neurological: She is alert and oriented to person, place, and time. She has normal reflexes. No cranial nerve deficit. She exhibits normal muscle tone. Coordination normal.  Skin: Skin is warm and dry. No rash noted. She is not diaphoretic. No erythema. No pallor.  Psychiatric:  She has a normal mood and affect. Her behavior is normal. Judgment and thought content normal.  Nursing note and vitals reviewed.   EKG: NSR; no acute changes.    Assessment & Plan:   1. Physical exam, annual   2. Essential hypertension, benign   3. Hyperlipidemia   4. Dyspnea   5. Screening for diabetes mellitus   6. Insomnia     1. Complete physical exam: anticipatory guidance --- weight loss and exercise.  No longer warrants pap smears due to hysterectomy status. Scheduled for mammogram this month.  Colonoscopy UTD. Immunizations UTD. 2. Gynecological exam: completed; refill of Vivelle dot provided; s/p hysterectomy thus no pap smear obtained.  Mammogram this month. 3.  HTN: controlled; obtain labs, u/a, EKG; continue current medications. 4.  Hyperlipidemia: stable; obtain labs; continue current medications. 5.  Screening DM: obtain glucose, HgbA1c. 6.  Dyspnea: New. Normal EKG and pulse oximetry; normal exam.  If persists or worsens, recommend referral to cardiology for further evaluation.  Pt declined referral today.  7. Insomnia: uncontrolled; rx for Trazodone provided.    Meds ordered this encounter  Medications  . traZODone (DESYREL) 50 MG tablet    Sig: Take 1-2 tablets (50-100 mg total) by mouth at bedtime as needed for sleep.    Dispense:  45 tablet    Refill:  3    Return in about 6 months (around 12/16/2014) for recheck.     Reginia Forts, M.D.  [facility] [address] [place], [place]  [address] [phone] phone [phone] fax

## 2014-06-17 ENCOUNTER — Other Ambulatory Visit: Payer: Self-pay | Admitting: Family Medicine

## 2014-06-18 MED ORDER — ESTRADIOL 0.1 MG/GM VA CREA: 1.0000 | TOPICAL_CREAM | VAGINAL | Status: DC

## 2014-06-18 NOTE — Telephone Encounter (Signed)
Pt called to ask for RFs on these medications that she thought Dr Tamala Julian was going to send in at her OV. Dr Tamala Julian, I pended these as she has been taking. She stated you didn't mention any changes in meds, but OV notes not complete yet so I couldn't see what you intended.

## 2014-06-28 ENCOUNTER — Ambulatory Visit: Payer: 59

## 2014-06-28 ENCOUNTER — Ambulatory Visit
Admission: RE | Admit: 2014-06-28 | Discharge: 2014-06-28 | Disposition: A | Discharge status: Home / Self Care | Payer: 59 | Source: Ambulatory Visit

## 2014-06-28 DIAGNOSIS — Z1231 Encounter for screening mammogram for malignant neoplasm of breast: Secondary | ICD-10-CM

## 2014-07-08 ENCOUNTER — Other Ambulatory Visit: Payer: Self-pay | Admitting: Family Medicine

## 2014-08-07 ENCOUNTER — Other Ambulatory Visit: Payer: Self-pay | Admitting: Physician Assistant

## 2014-09-14 ENCOUNTER — Encounter: Payer: Self-pay | Admitting: Family Medicine

## 2014-10-05 ENCOUNTER — Telehealth: Payer: Self-pay

## 2014-10-05 NOTE — Telephone Encounter (Signed)
VIVELLE-DOT 0.1 MG/24HR patch [[id number]]      Order Details    Dose, Route, Frequency: As Directed    Dispense Quantity:  8 patch Refills:  11 Fills Remaining:  11          Sig: PLACE 1 PATCH (0.1 MG TOTAL) ONTO THE SKIN 2 (TWO) TIMES A WEEK.         Written Date:  06/18/14 Expiration Date:  06/18/15     Start Date:  06/18/14 End Date:  --     Ordering Provider:  -- Authorizing Provider:  Wardell Honour, MD Ordering User:  Wardell Honour, MD               Dr. Tamala Julian is there any alternatives?

## 2014-10-05 NOTE — Telephone Encounter (Signed)
Pt states the pharmacy have been trying to get in touch with the Dr about changing her hormone patch to something else since they don't make it anymore, states this has been going on for over a week and still Hasn't heard from anyone. Please call [phone]    [facility] [address]

## 2014-10-08 MED ORDER — ESTRADIOL 0.1 MG/24HR TD PTWK: 0.1000 mg | MEDICATED_PATCH | TRANSDERMAL | Status: DC

## 2014-10-08 NOTE — Telephone Encounter (Signed)
Call --- I sent in an rx for Estradiol patch to pharmacy for patient to try.

## 2014-10-08 NOTE — Telephone Encounter (Signed)
Left detailed message letting pt know on voicemail.

## 2014-10-10 ENCOUNTER — Other Ambulatory Visit: Payer: Self-pay | Admitting: Family Medicine

## 2014-11-22 ENCOUNTER — Ambulatory Visit (INDEPENDENT_AMBULATORY_CARE_PROVIDER_SITE_OTHER): Payer: 59 | Admitting: Family Medicine

## 2014-11-22 VITALS — BP 122/78 | HR 95 | Temp 98.7°F | Resp 17 | Ht 67.0 in | Wt 178.0 lb

## 2014-11-22 DIAGNOSIS — R059 Cough, unspecified: Secondary | ICD-10-CM

## 2014-11-22 DIAGNOSIS — R05 Cough: Secondary | ICD-10-CM

## 2014-11-22 DIAGNOSIS — J029 Acute pharyngitis, unspecified: Secondary | ICD-10-CM

## 2014-11-22 DIAGNOSIS — J040 Acute laryngitis: Secondary | ICD-10-CM

## 2014-11-22 DIAGNOSIS — J069 Acute upper respiratory infection, unspecified: Secondary | ICD-10-CM

## 2014-11-22 LAB — POCT RAPID STREP A (OFFICE): Rapid Strep A Screen: NEGATIVE

## 2014-11-22 MED ORDER — BENZONATATE 200 MG PO CAPS
200.0000 mg | ORAL_CAPSULE | Freq: Three times a day (TID) | ORAL | Status: DC | PRN
Start: 2014-11-22 — End: 2014-12-07

## 2014-11-22 MED ORDER — AZITHROMYCIN 250 MG PO TABS: ORAL_TABLET | ORAL | Status: DC

## 2014-11-22 MED ORDER — HYDROCODONE-HOMATROPINE 5-1.5 MG/5ML PO SYRP: 5.0000 mL | ORAL_SOLUTION | Freq: Every evening | ORAL | Status: DC | PRN

## 2014-11-22 NOTE — Progress Notes (Signed)
Chief Complaint:  Chief Complaint  Patient presents with  . Cough  . Nasal Congestion  . URI  . Insomnia  . Anorexia    HPI: Erin Peters is a 59 y.o. female who is here for over 1 week history of URI sxs, clear to yellow sputum, she ha slaryngitis, tried advil cold and sinus then mucinex without releif and dayquil without releif. Has no fever os chill until todaym no appetite. Denies CP or SOB, but feels like settling down in her chest. She has some right ear pain, but no facial pain. Her nose is congested.   She has not been sleeping at night due to cough.   Past Medical History  Diagnosis Date  . Hypertension   . Chronic kidney disease   . Constipation   . Hyperlipidemia   . Arthritis     DDD lumbar   Past Surgical History  Procedure Laterality Date  . Cholecystectomy    . Hernia repair    . Lumbar disc surgery    . Hemorrhoid surgery    . Abdominal hysterectomy      uterine prolapse; ovaries intact.  . Tonsillectomy     History   Social History  . Marital Status: Married    Spouse Name: N/A  . Number of Children: N/A  . Years of Education: N/A   Occupational History  .  Unemployed    Payment processiong supervisor   Social History Main Topics  . Smoking status: Former Research scientist (life sciences)  . Smokeless tobacco: Former Systems developer  . Alcohol Use: No  . Drug Use: No  . Sexual Activity: Yes   Other Topics Concern  . None   Social History Narrative   Marital status: married x 39 years; happily; no abuse      Children: 2 children (daughter 29, son 5); grandchildrens (2)      Lives:  With husband.      Employment:  [employer] x 14 years;payment processing;  happy moderate.      Tobacco: none; former smoker.      Alcohol: none; rare      Exercise:  Walking/treadmill/elliptical/weights gym at [employer].  Goal is 5 days per week; usually 3 days per week.      Seatbelt:  100%      Guns: loaded and secured.               Family History  Problem Relation  Age of Onset  . Diabetes Mother   . Heart disease Mother 55    stents x 5; AMI age 74.  Marland Kitchen Hyperlipidemia Mother   . Hypertension Mother   . Dementia Mother   . Heart disease Maternal Grandmother   . Heart disease Maternal Grandfather    No Known Allergies Prior to Admission medications   Medication Sig Start Date End Date Taking? Authorizing Provider  atorvastatin (LIPITOR) 20 MG tablet Take 1 tablet (20 mg total) by mouth daily. 02/18/14  Yes Collene Leyden, PA-C  Biotin 1 MG CAPS Take 1 capsule by mouth daily.   Yes Historical Provider, MD  Cholecalciferol (D3 SUPER STRENGTH) 2000 UNITS CAPS Take by mouth.   Yes Historical Provider, MD  Coenzyme Q10 (CO Q-10) 200 MG CAPS Take by mouth.   Yes Historical Provider, MD  estradiol (CLIMARA - DOSED IN MG/24 HR) 0.1 mg/24hr patch Place 1 patch (0.1 mg total) onto the skin once a week. 10/08/14  Yes Wardell Honour, MD  estradiol (  ESTRACE) 0.1 MG/GM vaginal cream Place 1 Applicatorful vaginally once a week. 06/18/14  Yes Wardell Honour, MD  etodolac (LODINE) 300 MG capsule  12/29/13  Yes Historical Provider, MD  Glucosamine-Chondroitin (OSTEO BI-FLEX REGULAR STRENGTH PO) Take by mouth.   Yes Historical Provider, MD  HYDROcodone-acetaminophen (NORCO/VICODIN) 5-325 MG per tablet  12/03/13  Yes Historical Provider, MD  magnesium oxide (MAG-OX) 400 MG tablet Take 400 mg by mouth daily.   Yes Historical Provider, MD  Multiple Vitamin (MULTIVITAMIN) tablet Take 1 tablet by mouth daily.   Yes Historical Provider, MD  Multiple Vitamins-Minerals (HAIR/SKIN/NAILS PO) Take by mouth.   Yes Historical Provider, MD  olmesartan-hydrochlorothiazide (BENICAR HCT) 40-12.5 MG per tablet Take 1 tablet by mouth daily. 08/09/14  Yes Wardell Honour, MD  peg 3350 powder (MOVIPREP) 100 G SOLR Take 1 kit (200 g total) by mouth once. 07/15/13  Yes Ladene Artist, MD  Polyethylene Glycol 3350 (MIRALAX PO) Take by mouth.   Yes Historical Provider, MD  POTASSIUM GLUCONATE PO Take  99 mg by mouth.   Yes Historical Provider, MD  traZODone (DESYREL) 50 MG tablet TAKE 1-2 TABLETS (50-100 MG TOTAL) BY MOUTH AT BEDTIME AS NEEDED FOR SLEEP. 10/11/14  Yes Chelle Jeffrey, PA-C     ROS: The patient denies fevers, night sweats, unintentional weight loss, chest pain, palpitations, wheezing, dyspnea on exertion, nausea, vomiting, abdominal pain, dysuria, hematuria, melena, numbness, weakness, or tingling.  All other systems have been reviewed and were otherwise negative with the exception of those mentioned in the HPI and as above.    PHYSICAL EXAM: Filed Vitals:   11/22/14 1603  BP: 122/78  Pulse: 95  Temp: 98.7 F (37.1 C)  Resp: 17   Filed Vitals:   11/22/14 1603  Height: '5\' 7"'  (1.702 m)  Weight: 178 lb (80.74 kg)   Body mass index is 27.87 kg/(m^2).  General: Alert, no acute distress HEENT:  Normocephalic, atraumatic, oropharynx patent. EOMI, PERRLA TM normal, sinuses nontender,+erythematous oropharynx, she has had a tonsillectomy but there is residual tonsil tissue Cardiovascular:  Regular rate and rhythm, no rubs murmurs or gallops.  No Carotid bruits, radial pulse intact. No pedal edema.  Respiratory: Clear to auscultation bilaterally.  No wheezes, rales, or rhonchi.  No cyanosis, no use of accessory musculature GI: No organomegaly, abdomen is soft and non-tender, positive bowel sounds.  No masses. Skin: No rashes. Neurologic: Facial musculature symmetric. Psychiatric: Patient is appropriate throughout our interaction. Lymphatic: No cervical lymphadenopathy Musculoskeletal: Gait intact.   LABS: Results for orders placed or performed in visit on 11/22/14  POCT rapid strep A  Result Value Ref Range   Rapid Strep A Screen Negative Negative     EKG/XRAY:   Primary read interpreted by Dr. Marin Comment at [facility]   ASSESSMENT/PLAN: Encounter Diagnoses  Name Primary?  . Acute pharyngitis, unspecified pharyngitis type Yes  . Laryngitis   . Cough   . Acute upper  respiratory infection    Hycodan and z pack  And tessalon perles Declines xrays will return if worse, ok with plan since VSS Fu prn   Gross sideeffects, risk and benefits, and alternatives of medications d/w patient. Patient is aware that all medications have potential sideeffects and we are unable to predict every sideeffect or drug-drug interaction that may occur.  Ranbir Chew, Babb, DO 11/22/2014 5:14 PM

## 2014-11-24 LAB — CULTURE, GROUP A STREP: Organism ID, Bacteria: NORMAL

## 2014-12-07 ENCOUNTER — Ambulatory Visit (INDEPENDENT_AMBULATORY_CARE_PROVIDER_SITE_OTHER): Payer: 59 | Admitting: Internal Medicine

## 2014-12-07 ENCOUNTER — Ambulatory Visit (INDEPENDENT_AMBULATORY_CARE_PROVIDER_SITE_OTHER): Payer: 59

## 2014-12-07 VITALS — BP 148/100 | HR 84 | Temp 98.2°F | Resp 16 | Ht 67.5 in | Wt 180.6 lb

## 2014-12-07 DIAGNOSIS — J200 Acute bronchitis due to Mycoplasma pneumoniae: Secondary | ICD-10-CM

## 2014-12-07 DIAGNOSIS — R55 Syncope and collapse: Secondary | ICD-10-CM

## 2014-12-07 DIAGNOSIS — M25522 Pain in left elbow: Secondary | ICD-10-CM

## 2014-12-07 DIAGNOSIS — T07XXXA Unspecified multiple injuries, initial encounter: Secondary | ICD-10-CM

## 2014-12-07 DIAGNOSIS — T148 Other injury of unspecified body region: Secondary | ICD-10-CM

## 2014-12-07 DIAGNOSIS — R7989 Other specified abnormal findings of blood chemistry: Secondary | ICD-10-CM | POA: Diagnosis not present

## 2014-12-07 LAB — BASIC METABOLIC PANEL
BUN: 14 mg/dL (ref 6–23)
CALCIUM: 9.6 mg/dL (ref 8.4–10.5)
CO2: 31 mEq/L (ref 19–32)
CREATININE: 0.76 mg/dL (ref 0.50–1.10)
Chloride: 101 mEq/L (ref 96–112)
Glucose, Bld: 99 mg/dL (ref 70–99)
Potassium: 4.5 mEq/L (ref 3.5–5.3)
Sodium: 140 mEq/L (ref 135–145)

## 2014-12-07 LAB — T4, FREE: FREE T4: 1.3 ng/dL (ref 0.80–1.80)

## 2014-12-07 LAB — POCT CBC
Granulocyte percent: 78.8 %G (ref 37–80)
HCT, POC: 41.7 % (ref 37.7–47.9)
Hemoglobin: 13.8 g/dL (ref 12.2–16.2)
Lymph, poc: 1.9 (ref 0.6–3.4)
MCH, POC: 29.4 pg (ref 27–31.2)
MCHC: 33.1 g/dL (ref 31.8–35.4)
MCV: 88.7 fL (ref 80–97)
MID (CBC): 0.3 (ref 0–0.9)
MPV: 7.1 fL (ref 0–99.8)
PLATELET COUNT, POC: 333 10*3/uL (ref 142–424)
POC Granulocyte: 8.4 — AB (ref 2–6.9)
POC LYMPH PERCENT: 18.1 %L (ref 10–50)
POC MID %: 3.1 %M (ref 0–12)
RBC: 4.7 M/uL (ref 4.04–5.48)
RDW, POC: 12.8 %
WBC: 10.6 10*3/uL — AB (ref 4.6–10.2)

## 2014-12-07 LAB — TSH: TSH: 0.514 u[IU]/mL (ref 0.350–4.500)

## 2014-12-07 LAB — GLUCOSE, POCT (MANUAL RESULT ENTRY): POC Glucose: 93 mg/dl (ref 70–99)

## 2014-12-07 LAB — T3, FREE: T3 FREE: 2.9 pg/mL (ref 2.3–4.2)

## 2014-12-07 MED ORDER — MUPIROCIN 2 % EX OINT: 1.0000 "application " | TOPICAL_OINTMENT | Freq: Three times a day (TID) | CUTANEOUS | Status: DC

## 2014-12-07 MED ORDER — HYDROCODONE-ACETAMINOPHEN 5-325 MG PO TABS: 1.0000 | ORAL_TABLET | Freq: Four times a day (QID) | ORAL | Status: DC | PRN

## 2014-12-07 MED ORDER — AZITHROMYCIN 500 MG PO TABS: 500.0000 mg | ORAL_TABLET | Freq: Every day | ORAL | Status: DC

## 2014-12-07 NOTE — Progress Notes (Signed)
Subjective:    Patient ID: Erin Peters, female    DOB: 1955-10-17, 59 y.o.   MRN: [id number]  HPI Had syncope attack of unknown cause in parking lot of store. Climbed out of car took step then remembers nothing. Did not go to ER., occurred yesterday. Woke up on the ground with left elbow; painful unable to use, abrasions face, right hand, knee, shoulder. All other systems have full normal function. No dizzy, fatigue, cp, HA, speech change. Does have mild URI with cough   Review of Systems  Constitutional: Negative for fever, chills, diaphoresis, activity change and fatigue.  HENT: Positive for congestion and rhinorrhea.   Eyes: Negative.   Respiratory: Negative for cough, chest tightness and shortness of breath.   Cardiovascular: Negative.   Gastrointestinal: Negative.   Endocrine: Negative.   Genitourinary: Negative.   Musculoskeletal: Positive for back pain, joint swelling and arthralgias.  Neurological: Positive for syncope. Negative for dizziness, tremors, seizures, facial asymmetry, speech difficulty, weakness, light-headedness, numbness and headaches.  Psychiatric/Behavioral: Negative.        Objective:   Physical Exam  Constitutional: She is oriented to person, place, and time. She appears well-developed and well-nourished. No distress.  HENT:  Head: Normocephalic.  Right Ear: External ear normal.  Nose: Mucosal edema and rhinorrhea present. No epistaxis.  Mouth/Throat: Oropharynx is clear and moist.  Eyes: Conjunctivae and EOM are normal. Pupils are equal, round, and reactive to light.  Neck: Normal range of motion. Neck supple. No thyromegaly present.  Cardiovascular: Normal rate, regular rhythm, normal heart sounds and intact distal pulses.   Pulmonary/Chest: Effort normal and breath sounds normal.  Abdominal: Soft. Bowel sounds are normal. There is no tenderness.  Musculoskeletal: She exhibits edema and tenderness.       Left elbow: She exhibits decreased  range of motion, swelling and effusion. She exhibits no deformity and no laceration. Tenderness found. Olecranon process tenderness noted. No radial head, no medial epicondyle and no lateral epicondyle tenderness noted.  Lymphadenopathy:    She has no cervical adenopathy.  Neurological: She is alert and oriented to person, place, and time. She displays normal reflexes. No cranial nerve deficit. She exhibits normal muscle tone. Coordination normal.  Skin: Abrasion noted. Rash is not pustular. She is not diaphoretic.     Psychiatric: She has a normal mood and affect. Her behavior is normal.   EKG normal [facility] reading (PRIMARY) by  Dr.Thurlow Gallaga.large fat pads anterior/posterior , subtle fx distal humerus  Results for orders placed or performed in visit on 12/07/14  POCT CBC  Result Value Ref Range   WBC 10.6 (A) 4.6 - 10.2 K/uL   Lymph, poc 1.9 0.6 - 3.4   POC LYMPH PERCENT 18.1 10 - 50 %L   MID (cbc) 0.3 0 - 0.9   POC MID % 3.1 0 - 12 %M   POC Granulocyte 8.4 (A) 2 - 6.9   Granulocyte percent 78.8 37 - 80 %G   RBC 4.70 4.04 - 5.48 M/uL   Hemoglobin 13.8 12.2 - 16.2 g/dL   HCT, POC 41.7 37.7 - 47.9 %   MCV 88.7 80 - 97 fL   MCH, POC 29.4 27 - 31.2 pg   MCHC 33.1 31.8 - 35.4 g/dL   RDW, POC 12.8 %   Platelet Count, POC 333 142 - 424 K/uL   MPV 7.1 0 - 99.8 fL  POCT glucose (manual entry)  Result Value Ref Range   POC Glucose 93 70 - 99 mg/dl  Assessment & Plan:  Syncope  Fracture elbowSling/RICE/refer to ortho Pain left elbow Multiple abrasions/Wound care

## 2014-12-07 NOTE — Patient Instructions (Addendum)
Syncope Syncope is a medical term for fainting or passing out. This means you lose consciousness and drop to the ground. People are generally unconscious for less than 5 minutes. You may have some muscle twitches for up to 15 seconds before waking up and returning to normal. Syncope occurs more often in older adults, but it can happen to anyone. While most causes of syncope are not dangerous, syncope can be a sign of a serious medical problem. It is important to seek medical care.  CAUSES  Syncope is caused by a sudden drop in blood flow to the brain. The specific cause is often not determined. Factors that can bring on syncope include:  Taking medicines that lower blood pressure.  Sudden changes in posture, such as standing up quickly.  Taking more medicine than prescribed.  Standing in one place for too long.  Seizure disorders.  Dehydration and excessive exposure to heat.  Low blood sugar (hypoglycemia).  Straining to have a bowel movement.  Heart disease, irregular heartbeat, or other circulatory problems.  Fear, emotional distress, seeing blood, or severe pain. SYMPTOMS  Right before fainting, you may:  Feel dizzy or light-headed.  Feel nauseous.  See all white or all black in your field of vision.  Have cold, clammy skin. DIAGNOSIS  Your health care provider will ask about your symptoms, perform a physical exam, and perform an electrocardiogram (ECG) to record the electrical activity of your heart. Your health care provider may also perform other heart or blood tests to determine the cause of your syncope which may include:  Transthoracic echocardiogram (TTE). During echocardiography, sound waves are used to evaluate how blood flows through your heart.  Transesophageal echocardiogram (TEE).  Cardiac monitoring. This allows your health care provider to monitor your heart rate and rhythm in real time.  Holter monitor. This is a portable device that records your  heartbeat and can help diagnose heart arrhythmias. It allows your health care provider to track your heart activity for several days, if needed.  Stress tests by exercise or by giving medicine that makes the heart beat faster. TREATMENT  In most cases, no treatment is needed. Depending on the cause of your syncope, your health care provider may recommend changing or stopping some of your medicines. HOME CARE INSTRUCTIONS  Have someone stay with you until you feel stable.  Do not drive, use machinery, or play sports until your health care provider says it is okay.  Keep all follow-up appointments as directed by your health care provider.  Lie down right away if you start feeling like you might faint. Breathe deeply and steadily. Wait until all the symptoms have passed.  Drink enough fluids to keep your urine clear or pale yellow.  If you are taking blood pressure or heart medicine, get up slowly and take several minutes to sit and then stand. This can reduce dizziness. SEEK IMMEDIATE MEDICAL CARE IF:   You have a severe headache.  You have unusual pain in the chest, abdomen, or back.  You are bleeding from your mouth or rectum, or you have black or tarry stool.  You have an irregular or very fast heartbeat.  You have pain with breathing.  You have repeated fainting or seizure-like jerking during an episode.  You faint when sitting or lying down.  You have confusion.  You have trouble walking.  You have severe weakness.  You have vision problems. If you fainted, call your local emergency services (911 in [place] Do not drive   yourself to the hospital.  MAKE SURE YOU:  Understand these instructions.  Will watch your condition.  Will get help right away if you are not doing well or get worse. Document Released: 06/25/2005 Document Revised: 06/30/2013 Document Reviewed: 08/24/2011 [facility] Patient Information 2015 [place], [place]. This information is not intended to replace  advice given to you by your health care provider. Make sure you discuss any questions you have with your health care provider. Elbow Fracture, Simple A fracture is a break in one of the bones.When fractures are not displaced or separated, they may be treated with only a sling or splint. The sling or splint may only be required for two to three weeks. In these cases, often the elbow is put through early range of motion exercises to prevent the elbow from getting stiff. DIAGNOSIS  The diagnosis (learning what is wrong) of a fractured elbow is made by x-ray. These may be required before and after the elbow is put into a splint or cast. X-rays are taken after to make sure the bone pieces have not moved. HOME CARE INSTRUCTIONS   Only take over-the-counter or prescription medicines for pain, discomfort, or fever as directed by your caregiver.  If you have a splint held on with an elastic wrap and your hand or fingers become numb or cold and blue, loosen the wrap and reapply more loosely. See your caregiver if there is no relief.  You may use ice for twenty minutes, four times per day, for the first two to three days.  Use your elbow as directed.  See your caregiver as directed. It is very important to keep all follow-up referrals and appointments in order to avoid any long-term problems with your elbow including chronic pain or stiffness. SEEK IMMEDIATE MEDICAL CARE IF:   There is swelling or increasing pain in elbow.  You begin to lose feeling or experience numbness or tingling in your hand or fingers.  You develop swelling of the hand and fingers.  You get a cold or blue hand or fingers on affected side. MAKE SURE YOU:   Understand these instructions.  Will watch your condition.  Will get help right away if you are not doing well or get worse. Document Released: 06/19/2001 Document Revised: 09/17/2011 Document Reviewed: 05/10/2009 [facility] Patient Information 2015 [place], [place]. This  information is not intended to replace advice given to you by your health care provider. Make sure you discuss any questions you have with your health care provider.

## 2014-12-21 ENCOUNTER — Encounter: Payer: Self-pay | Admitting: Physician Assistant

## 2014-12-21 ENCOUNTER — Ambulatory Visit (INDEPENDENT_AMBULATORY_CARE_PROVIDER_SITE_OTHER): Payer: 59 | Admitting: Physician Assistant

## 2014-12-21 VITALS — BP 125/81 | HR 73 | Temp 98.4°F | Resp 16 | Ht 67.0 in | Wt 179.0 lb

## 2014-12-21 DIAGNOSIS — Z026 Encounter for examination for insurance purposes: Secondary | ICD-10-CM

## 2014-12-21 DIAGNOSIS — Z1211 Encounter for screening for malignant neoplasm of colon: Secondary | ICD-10-CM

## 2014-12-21 LAB — POC HEMOCCULT BLD/STL (OFFICE/1-CARD/DIAGNOSTIC): FECAL OCCULT BLD: NEGATIVE

## 2014-12-21 NOTE — Progress Notes (Signed)
[facility] [address], [place] [address] [phone]  Date:  12/21/2014   Name:  Erin Peters   DOB:  October 12, 1955   MRN:  [id number]  PCP:  Reginia Forts, MD    Chief Complaint: Labs Only   History of Present Illness:  This is a 59 y.o. female who is presenting wants labs for insurance purposes. She received a letter in the mail from her insurance company advising she get a hemoccult test and microalbumin before 01/2015. Pt had a colonoscopy 8 years ago and normal. No history of DM or kidney disease. Last A1C 6 months ago 5.9. Pt does not need anything else today. She has appointment with Dr. Tamala Julian 02/2015.  Review of Systems:  Review of Systems  Constitutional: Negative for fever and chills.  Gastrointestinal: Negative for constipation, blood in stool and rectal pain.  Skin: Negative for rash.  Hematological: Negative for adenopathy.    Patient Active Problem List   Diagnosis Date Noted  . Fecal incontinence 07/15/2013  . Unspecified constipation 07/15/2013    Prior to Admission medications   Medication Sig Start Date End Date Taking? Authorizing Provider  atorvastatin (LIPITOR) 20 MG tablet Take 1 tablet (20 mg total) by mouth daily. 02/18/14  Yes Collene Leyden, PA-C  Biotin 1 MG CAPS Take 1 capsule by mouth daily.   Yes Historical Provider, MD  Cholecalciferol (D3 SUPER STRENGTH) 2000 UNITS CAPS Take by mouth.   Yes Historical Provider, MD  Coenzyme Q10 (CO Q-10) 200 MG CAPS Take by mouth.   Yes Historical Provider, MD  estradiol (CLIMARA - DOSED IN MG/24 HR) 0.1 mg/24hr patch Place 1 patch (0.1 mg total) onto the skin once a week. 10/08/14  Yes Wardell Honour, MD  estradiol (ESTRACE) 0.1 MG/GM vaginal cream Place 1 Applicatorful vaginally once a week. 06/18/14  Yes Wardell Honour, MD  etodolac (LODINE) 300 MG capsule  12/29/13  Yes Historical Provider, MD  Glucosamine-Chondroitin (OSTEO BI-FLEX REGULAR STRENGTH PO) Take by mouth.   Yes  Historical Provider, MD  magnesium oxide (MAG-OX) 400 MG tablet Take 400 mg by mouth daily.   Yes Historical Provider, MD  Multiple Vitamin (MULTIVITAMIN) tablet Take 1 tablet by mouth daily.   Yes Historical Provider, MD  Multiple Vitamins-Minerals (HAIR/SKIN/NAILS PO) Take by mouth.   Yes Historical Provider, MD  mupirocin ointment (BACTROBAN) 2 % Apply 1 application topically 3 (three) times daily. 12/07/14  Yes Orma Flaming, MD  olmesartan-hydrochlorothiazide (BENICAR HCT) 40-12.5 MG per tablet Take 1 tablet by mouth daily. 08/09/14  Yes Wardell Honour, MD  Polyethylene Glycol 3350 (MIRALAX PO) Take by mouth.   Yes Historical Provider, MD  POTASSIUM GLUCONATE PO Take 99 mg by mouth.   Yes Historical Provider, MD  traZODone (DESYREL) 50 MG tablet TAKE 1-2 TABLETS (50-100 MG TOTAL) BY MOUTH AT BEDTIME AS NEEDED FOR SLEEP. 10/11/14  Yes Harrison Mons, PA-C    No Known Allergies  Past Surgical History  Procedure Laterality Date  . Cholecystectomy    . Hernia repair    . Lumbar disc surgery    . Hemorrhoid surgery    . Abdominal hysterectomy      uterine prolapse; ovaries intact.  . Tonsillectomy      History  Substance Use Topics  . Smoking status: Former Research scientist (life sciences)  . Smokeless tobacco: Former Systems developer    Quit date: 12/06/1976     Comment: not sure of year, she was in her 20's, she  was not an everyday smoker   . Alcohol Use: No    Family History  Problem Relation Age of Onset  . Diabetes Mother   . Heart disease Mother 36    stents x 5; AMI age 23.  Marland Kitchen Hyperlipidemia Mother   . Hypertension Mother   . Dementia Mother   . Heart disease Maternal Grandmother   . Heart disease Maternal Grandfather     Medication list has been reviewed and updated.  Physical Examination:  Physical Exam  Constitutional: She is oriented to person, place, and time. She appears well-developed and well-nourished. No distress.  HENT:  Head: Normocephalic and atraumatic.  Right Ear: Hearing normal.   Left Ear: Hearing normal.  Nose: Nose normal.  Eyes: Conjunctivae and lids are normal. Right eye exhibits no discharge. Left eye exhibits no discharge. No scleral icterus.  Cardiovascular: Normal rate and regular rhythm.   Pulmonary/Chest: Effort normal and breath sounds normal. No respiratory distress.  Genitourinary: Rectal exam shows no external hemorrhoid, no internal hemorrhoid, no fissure, no mass, no tenderness and anal tone normal. Guaiac negative stool.  Musculoskeletal: Normal range of motion.  Neurological: She is alert and oriented to person, place, and time.  Skin: Skin is warm, dry and intact. No lesion and no rash noted.  Psychiatric: She has a normal mood and affect. Her speech is normal and behavior is normal. Thought content normal.   BP 125/81 mmHg  Pulse 73  Temp(Src) 98.4 F (36.9 C)  Resp 16  Ht 5\' 7"  (1.702 m)  Wt 179 lb (81.194 kg)  BMI 28.03 kg/m2  SpO2 99%  Results for orders placed or performed in visit on 12/21/14  POC Hemoccult Bld/Stl (1-Cd Office Dx)  Result Value Ref Range   Card #1 Date 12/21/2014    Fecal Occult Blood, POC Negative Negative    Assessment and Plan:  1. Colon cancer screening 2. Encounter for insurance exam Hem occult negative. microalbumin pending. Return 02/2015 for follow up with Dr. Tamala Julian. - POC Hemoccult Bld/Stl (1-Cd Office Dx) - Microalbumin, urine   Benjaman Pott. Drenda Freeze, MHS [facility]  12/21/2014

## 2014-12-21 NOTE — Patient Instructions (Signed)
I will call you with results of your lab tests. Return in August for appointment with Dr. Tamala Julian

## 2014-12-22 ENCOUNTER — Ambulatory Visit: Payer: 59 | Admitting: Family Medicine

## 2014-12-22 LAB — MICROALBUMIN, URINE: Microalb, Ur: 0.2 mg/dL (ref <2.0)

## 2015-01-21 ENCOUNTER — Telehealth: Payer: Self-pay

## 2015-01-21 NOTE — Telephone Encounter (Signed)
Pt is requesting a refill of LIPITOR to hold her over until her appt with Dr. Tamala Julian 05/04/15

## 2015-01-24 MED ORDER — ATORVASTATIN CALCIUM 20 MG PO TABS: 20.0000 mg | ORAL_TABLET | Freq: Every day | ORAL | Status: DC

## 2015-01-24 NOTE — Telephone Encounter (Signed)
Refill sent. Pt notified through mychart.

## 2015-01-28 ENCOUNTER — Ambulatory Visit: Payer: Self-pay | Admitting: Family Medicine

## 2015-01-29 ENCOUNTER — Telehealth: Payer: Self-pay | Admitting: Family Medicine

## 2015-01-29 NOTE — Telephone Encounter (Signed)
lmom to call and reschedule her appt she had with Tamala Julian on Aug 24

## 2015-01-29 NOTE — Telephone Encounter (Signed)
Patient is not diabetic; she is not on Metformin; please clarify.

## 2015-01-29 NOTE — Telephone Encounter (Signed)
Wrong patient sorry

## 2015-01-29 NOTE — Telephone Encounter (Signed)
Patient need refill on Metformin

## 2015-02-09 ENCOUNTER — Other Ambulatory Visit: Payer: Self-pay | Admitting: Family Medicine

## 2015-03-02 ENCOUNTER — Encounter: Payer: 59 | Admitting: Family Medicine

## 2015-03-09 ENCOUNTER — Ambulatory Visit (INDEPENDENT_AMBULATORY_CARE_PROVIDER_SITE_OTHER): Payer: Commercial Managed Care - HMO | Admitting: Family Medicine

## 2015-03-09 ENCOUNTER — Encounter: Payer: Self-pay | Admitting: Family Medicine

## 2015-03-09 VITALS — BP 116/76 | HR 80 | Temp 98.5°F | Resp 16 | Ht 67.0 in | Wt 181.8 lb

## 2015-03-09 DIAGNOSIS — I10 Essential (primary) hypertension: Secondary | ICD-10-CM | POA: Diagnosis not present

## 2015-03-09 DIAGNOSIS — T753XXA Motion sickness, initial encounter: Secondary | ICD-10-CM

## 2015-03-09 DIAGNOSIS — R7302 Impaired glucose tolerance (oral): Secondary | ICD-10-CM | POA: Diagnosis not present

## 2015-03-09 DIAGNOSIS — F40243 Fear of flying: Secondary | ICD-10-CM

## 2015-03-09 DIAGNOSIS — E785 Hyperlipidemia, unspecified: Secondary | ICD-10-CM

## 2015-03-09 DIAGNOSIS — Z114 Encounter for screening for human immunodeficiency virus [HIV]: Secondary | ICD-10-CM

## 2015-03-09 DIAGNOSIS — Z1159 Encounter for screening for other viral diseases: Secondary | ICD-10-CM

## 2015-03-09 DIAGNOSIS — G4700 Insomnia, unspecified: Secondary | ICD-10-CM | POA: Diagnosis not present

## 2015-03-09 DIAGNOSIS — R55 Syncope and collapse: Secondary | ICD-10-CM | POA: Diagnosis not present

## 2015-03-09 LAB — COMPREHENSIVE METABOLIC PANEL
ALK PHOS: 77 U/L (ref 33–130)
ALT: 42 U/L — AB (ref 6–29)
AST: 35 U/L (ref 10–35)
Albumin: 4.9 g/dL (ref 3.6–5.1)
BILIRUBIN TOTAL: 0.6 mg/dL (ref 0.2–1.2)
BUN: 11 mg/dL (ref 7–25)
CALCIUM: 10.4 mg/dL (ref 8.6–10.4)
CO2: 29 mmol/L (ref 20–31)
CREATININE: 0.61 mg/dL (ref 0.50–1.05)
Chloride: 103 mmol/L (ref 98–110)
GLUCOSE: 89 mg/dL (ref 65–99)
Potassium: 4.8 mmol/L (ref 3.5–5.3)
SODIUM: 142 mmol/L (ref 135–146)
Total Protein: 7.4 g/dL (ref 6.1–8.1)

## 2015-03-09 LAB — CBC WITH DIFFERENTIAL/PLATELET
BASOS ABS: 0 10*3/uL (ref 0.0–0.1)
Basophils Relative: 0 % (ref 0–1)
Eosinophils Absolute: 0.1 10*3/uL (ref 0.0–0.7)
Eosinophils Relative: 1 % (ref 0–5)
HEMATOCRIT: 42.7 % (ref 36.0–46.0)
HEMOGLOBIN: 14.5 g/dL (ref 12.0–15.0)
LYMPHS PCT: 34 % (ref 12–46)
Lymphs Abs: 2 10*3/uL (ref 0.7–4.0)
MCH: 30.2 pg (ref 26.0–34.0)
MCHC: 34 g/dL (ref 30.0–36.0)
MCV: 89 fL (ref 78.0–100.0)
MPV: 9.8 fL (ref 8.6–12.4)
Monocytes Absolute: 0.5 10*3/uL (ref 0.1–1.0)
Monocytes Relative: 8 % (ref 3–12)
NEUTROS PCT: 57 % (ref 43–77)
Neutro Abs: 3.3 10*3/uL (ref 1.7–7.7)
Platelets: 296 10*3/uL (ref 150–400)
RBC: 4.8 MIL/uL (ref 3.87–5.11)
RDW: 13 % (ref 11.5–15.5)
WBC: 5.8 10*3/uL (ref 4.0–10.5)

## 2015-03-09 LAB — LIPID PANEL
Cholesterol: 183 mg/dL (ref 125–200)
HDL: 51 mg/dL (ref >=46)
LDL CALC: 94 mg/dL (ref <130)
Total CHOL/HDL Ratio: 3.6 Ratio (ref <=5.0)
Triglycerides: 188 mg/dL — ABNORMAL HIGH (ref <150)
VLDL: 38 mg/dL — ABNORMAL HIGH (ref <30)

## 2015-03-09 MED ORDER — ONDANSETRON 8 MG PO TBDP: 8.0000 mg | ORAL_TABLET | Freq: Three times a day (TID) | ORAL | Status: DC | PRN

## 2015-03-09 MED ORDER — OLMESARTAN MEDOXOMIL-HCTZ 40-12.5 MG PO TABS: 1.0000 | ORAL_TABLET | Freq: Every day | ORAL | Status: DC

## 2015-03-09 MED ORDER — SCOPOLAMINE 1 MG/3DAYS TD PT72: 1.0000 | MEDICATED_PATCH | TRANSDERMAL | Status: DC

## 2015-03-09 MED ORDER — ESTRADIOL 0.1 MG/24HR TD PTTW: 1.0000 | MEDICATED_PATCH | TRANSDERMAL | Status: DC

## 2015-03-09 MED ORDER — ALPRAZOLAM 0.5 MG PO TABS: 0.5000 mg | ORAL_TABLET | Freq: Every day | ORAL | Status: DC | PRN

## 2015-03-09 MED ORDER — TRAZODONE HCL 50 MG PO TABS: ORAL_TABLET | ORAL | Status: DC

## 2015-03-09 MED ORDER — ATORVASTATIN CALCIUM 20 MG PO TABS: 20.0000 mg | ORAL_TABLET | Freq: Every day | ORAL | Status: DC

## 2015-03-09 NOTE — Progress Notes (Signed)
Subjective:    Patient ID: Erin Peters, female    DOB: May 05, 1956, 59 y.o.   MRN: [id number]  03/09/2015  Follow-up; Medication Refill; Hyperlipidemia; and Hypertension   HPI This 59 y.o. female presents for nine month follow-up of the following:   1. Syncopal event: occurred in 11/2014; followed up 24 hours after event;  referred to cardiology after visit with Dr. Elder Cyphers.  Labs all normal including CBC, CMET, TSH, free T4.  EKG stable at visit with Dr. Elder Cyphers.   S/p stress testing, echo, Holter monitor for one month; negative work up.   Fractured wrist, elbow; bruising to face, knees.  Performed orthostatics at cardiology office. Cardiologist decreased Benicar/HCTZ dose.  2. HTN: Patient reports good compliance with medication, good tolerance to medication, and good symptom control.  Cardiologist decreased Benicar/HCTZ to 1/2 tablet daily.  No dizziness.  Urine microalbumin negative at recent visit; insurance required test. BP at home ranging normal most of the time.  3.  Hyperlipidemia: Patient reports good compliance with medication, good tolerance to medication, and good symptom control.    4. Glucose Intolerance:  Presenting for repeat labs.    5. Travel: [place] for 52DP wedding anniversary.  Needs scopolamine patch for boating trip and Xanax for flying.  Also needs something for nausea for flight.    6. HRT: requesting smaller patch; Vivelle Dot was discontinued and switched to different HRT patch.  7. Insomnia:  Taking Trazodone PRN only at this point.  Review of Systems  Constitutional: Negative for fever, chills, diaphoresis and fatigue.  Eyes: Negative for visual disturbance.  Respiratory: Negative for cough and shortness of breath.   Cardiovascular: Negative for chest pain, palpitations and leg swelling.  Gastrointestinal: Negative for nausea, vomiting, abdominal pain, diarrhea and constipation.  Endocrine: Negative for cold intolerance, heat  intolerance, polydipsia, polyphagia and polyuria.  Neurological: Negative for dizziness, tremors, seizures, syncope, facial asymmetry, speech difficulty, weakness, light-headedness, numbness and headaches.    Past Medical History  Diagnosis Date  . Hypertension   . Chronic kidney disease   . Constipation   . Hyperlipidemia   . Arthritis     DDD lumbar   Past Surgical History  Procedure Laterality Date  . Cholecystectomy    . Hernia repair    . Lumbar disc surgery    . Hemorrhoid surgery    . Abdominal hysterectomy      uterine prolapse; ovaries intact.  . Tonsillectomy     No Known Allergies Current Outpatient Prescriptions  Medication Sig Dispense Refill  . atorvastatin (LIPITOR) 20 MG tablet Take 1 tablet (20 mg total) by mouth daily. 90 tablet 3  . Biotin 1 MG CAPS Take 1 capsule by mouth daily.    . Cholecalciferol (D3 SUPER STRENGTH) 2000 UNITS CAPS Take by mouth.    . Coenzyme Q10 (CO Q-10) 200 MG CAPS Take by mouth.    . estradiol (ESTRACE) 0.1 MG/GM vaginal cream Place 1 Applicatorful vaginally once a week. 42.5 g 11  . Glucosamine-Chondroitin (OSTEO BI-FLEX REGULAR STRENGTH PO) Take by mouth.    . magnesium oxide (MAG-OX) 400 MG tablet Take 400 mg by mouth daily.    . Multiple Vitamin (MULTIVITAMIN) tablet Take 1 tablet by mouth daily.    . Multiple Vitamins-Minerals (HAIR/SKIN/NAILS PO) Take by mouth.    . mupirocin ointment (BACTROBAN) 2 % Apply 1 application topically 3 (three) times daily. 30 g 3  . olmesartan-hydrochlorothiazide (BENICAR HCT) 40-12.5 MG per tablet Take 1 tablet  by mouth daily. 90 tablet 1  . Polyethylene Glycol 3350 (MIRALAX PO) Take by mouth.    Marland Kitchen POTASSIUM GLUCONATE PO Take 99 mg by mouth.    . traZODone (DESYREL) 50 MG tablet TAKE 1-2 TABLETS (50-100 MG TOTAL) BY MOUTH AT BEDTIME AS NEEDED FOR SLEEP. 90 tablet 3  . ALPRAZolam (XANAX) 0.5 MG tablet Take 1 tablet (0.5 mg total) by mouth daily as needed for anxiety. 10 tablet 0  . estradiol  (VIVELLE-DOT) 0.1 MG/24HR patch Place 1 patch (0.1 mg total) onto the skin 2 (two) times a week. 8 patch 12  . etodolac (LODINE) 300 MG capsule     . ondansetron (ZOFRAN-ODT) 8 MG disintegrating tablet Take 1 tablet (8 mg total) by mouth every 8 (eight) hours as needed for nausea. 20 tablet 0  . scopolamine (TRANSDERM-SCOP) 1 MG/3DAYS Place 1 patch (1.5 mg total) onto the skin every 3 (three) days. 4 patch 0   No current facility-administered medications for this visit.   Social History   Social History  . Marital Status: Married    Spouse Name: N/A  . Number of Children: N/A  . Years of Education: N/A   Occupational History  .  Unemployed    Payment processiong supervisor   Social History Main Topics  . Smoking status: Former Research scientist (life sciences)  . Smokeless tobacco: Former Systems developer    Quit date: 12/06/1976     Comment: not sure of year, she was in her 20's, she was not an everyday smoker   . Alcohol Use: No  . Drug Use: No  . Sexual Activity: Yes   Other Topics Concern  . Not on file   Social History Narrative   Marital status: married x 39 years; happily; no abuse      Children: 2 children (daughter 48, son 54); grandchildrens (2)      Lives:  With husband.      Employment:  [employer] x 14 years;payment processing;  happy moderate.      Tobacco: none; former smoker.      Alcohol: none; rare      Exercise:  Walking/treadmill/elliptical/weights gym at [place].  Goal is 5 days per week; usually 3 days per week.      Seatbelt:  100%      Guns: loaded and secured.               Family History  Problem Relation Age of Onset  . Diabetes Mother   . Heart disease Mother 2    stents x 5; AMI age 1.  Marland Kitchen Hyperlipidemia Mother   . Hypertension Mother   . Dementia Mother   . Heart disease Maternal Grandmother   . Heart disease Maternal Grandfather        Objective:    BP 116/76 mmHg  Pulse 80  Temp(Src) 98.5 F (36.9 C) (Oral)  Resp 16  Ht 5\' 7"  (1.702 m)  Wt 181 lb 12.8  oz (82.464 kg)  BMI 28.47 kg/m2 Physical Exam  Constitutional: She is oriented to person, place, and time. She appears well-developed and well-nourished. No distress.  HENT:  Head: Normocephalic and atraumatic.  Right Ear: External ear normal.  Left Ear: External ear normal.  Nose: Nose normal.  Mouth/Throat: Oropharynx is clear and moist.  Eyes: Conjunctivae and EOM are normal. Pupils are equal, round, and reactive to light.  Neck: Normal range of motion. Neck supple. Carotid bruit is not present. No thyromegaly present.  Cardiovascular: Normal rate, regular rhythm,  normal heart sounds and intact distal pulses.  Exam reveals no gallop and no friction rub.   No murmur heard. Pulmonary/Chest: Effort normal and breath sounds normal. She has no wheezes. She has no rales.  Abdominal: Soft. Bowel sounds are normal. She exhibits no distension and no mass. There is no tenderness. There is no rebound and no guarding.  Lymphadenopathy:    She has no cervical adenopathy.  Neurological: She is alert and oriented to person, place, and time. No cranial nerve deficit. She exhibits normal muscle tone. Coordination normal.  Skin: Skin is warm and dry. No rash noted. She is not diaphoretic. No erythema. No pallor.  Psychiatric: She has a normal mood and affect. Her behavior is normal.   Results for orders placed or performed in visit on 12/21/14  Microalbumin, urine  Result Value Ref Range   Microalb, Ur 0.2 <2.0 mg/dL  POC Hemoccult Bld/Stl (1-Cd Office Dx)  Result Value Ref Range   Card #1 Date 12/21/2014    Fecal Occult Blood, POC Negative Negative       Assessment & Plan:   1. Essential hypertension   2. Hyperlipidemia   3. Glucose intolerance (impaired glucose tolerance)   4. Syncope and collapse   5. Need for hepatitis C screening test   6. Screening for HIV (human immunodeficiency virus)   7. Fear of flying   8. Motion sickness, initial encounter   9. Insomnia     1. HTN:  controlled; continue Benicar/HCTZ to 1/2 tablet daily. Obtain labs. Refill provided. 2.  Hyperlipidemia: controlled; obtain labs; refill provided. 3.  Glucose Intolerance; stable; obtain labs; recommend dietary modification. 4.  Syncope: resolved; s/p cardiac evaluation with echo, ETT, and Holter monitor.  No cardiac etiology to syncopal event; hypotension suggestive. 5.  Need for Hepatitis C screening: obtain Hepatitis C ab. 6.  Screening HIV: obtain HIV; pt agreeable. 7. Fear of flying: New.  Rx for Xanax provided for upcoming flight. 8.  Motion sickness: rx for scopolamine patch and Zofran provided. 9. HRT: refill of HRT patch provided.    Orders Placed This Encounter  Procedures  . CBC with Differential/Platelet  . Comprehensive metabolic panel    Order Specific Question:  Has the patient fasted?    Answer:  Yes  . Lipid panel    Order Specific Question:  Has the patient fasted?    Answer:  Yes  . Hemoglobin A1c  . HIV antibody  . Hepatitis C antibody   Meds ordered this encounter  Medications  . traZODone (DESYREL) 50 MG tablet    Sig: TAKE 1-2 TABLETS (50-100 MG TOTAL) BY MOUTH AT BEDTIME AS NEEDED FOR SLEEP.    Dispense:  90 tablet    Refill:  3  . atorvastatin (LIPITOR) 20 MG tablet    Sig: Take 1 tablet (20 mg total) by mouth daily.    Dispense:  90 tablet    Refill:  3  . olmesartan-hydrochlorothiazide (BENICAR HCT) 40-12.5 MG per tablet    Sig: Take 1 tablet by mouth daily.    Dispense:  90 tablet    Refill:  1  . ALPRAZolam (XANAX) 0.5 MG tablet    Sig: Take 1 tablet (0.5 mg total) by mouth daily as needed for anxiety.    Dispense:  10 tablet    Refill:  0  . scopolamine (TRANSDERM-SCOP) 1 MG/3DAYS    Sig: Place 1 patch (1.5 mg total) onto the skin every 3 (three) days.    Dispense:  4 patch    Refill:  0  . ondansetron (ZOFRAN-ODT) 8 MG disintegrating tablet    Sig: Take 1 tablet (8 mg total) by mouth every 8 (eight) hours as needed for nausea.     Dispense:  20 tablet    Refill:  0  . estradiol (VIVELLE-DOT) 0.1 MG/24HR patch    Sig: Place 1 patch (0.1 mg total) onto the skin 2 (two) times a week.    Dispense:  8 patch    Refill:  12    Return in about 6 months (around 09/06/2015) for complete physical examiniation.    Lunden Stieber Elayne Guerin, M.D. [facility] [address] [place], [place]  [address] [phone] phone [phone] fax

## 2015-03-10 LAB — HIV ANTIBODY (ROUTINE TESTING W REFLEX): HIV: NONREACTIVE

## 2015-03-10 LAB — HEMOGLOBIN A1C
HEMOGLOBIN A1C: 5.7 % — AB (ref <5.7)
MEAN PLASMA GLUCOSE: 117 mg/dL — AB (ref <117)

## 2015-03-10 LAB — HEPATITIS C ANTIBODY: HCV Ab: NEGATIVE

## 2015-03-13 ENCOUNTER — Other Ambulatory Visit: Payer: Self-pay | Admitting: Family Medicine

## 2015-05-04 ENCOUNTER — Encounter: Payer: 59 | Admitting: Family Medicine

## 2015-05-22 ENCOUNTER — Other Ambulatory Visit: Payer: Self-pay | Admitting: Family Medicine

## 2015-06-20 ENCOUNTER — Other Ambulatory Visit: Payer: Self-pay

## 2015-06-20 DIAGNOSIS — Z1231 Encounter for screening mammogram for malignant neoplasm of breast: Secondary | ICD-10-CM

## 2015-07-08 ENCOUNTER — Ambulatory Visit (INDEPENDENT_AMBULATORY_CARE_PROVIDER_SITE_OTHER): Payer: Commercial Managed Care - HMO | Admitting: Family Medicine

## 2015-07-08 VITALS — BP 122/80 | HR 90 | Temp 98.1°F | Resp 18 | Ht 67.0 in | Wt 170.0 lb

## 2015-07-08 DIAGNOSIS — A09 Infectious gastroenteritis and colitis, unspecified: Secondary | ICD-10-CM | POA: Diagnosis not present

## 2015-07-08 DIAGNOSIS — J069 Acute upper respiratory infection, unspecified: Secondary | ICD-10-CM

## 2015-07-08 DIAGNOSIS — R059 Cough, unspecified: Secondary | ICD-10-CM

## 2015-07-08 DIAGNOSIS — J9801 Acute bronchospasm: Secondary | ICD-10-CM | POA: Diagnosis not present

## 2015-07-08 DIAGNOSIS — R05 Cough: Secondary | ICD-10-CM

## 2015-07-08 LAB — POCT CBC
GRANULOCYTE PERCENT: 70.6 % (ref 37–80)
HCT, POC: 41 % (ref 37.7–47.9)
HEMOGLOBIN: 14 g/dL (ref 12.2–16.2)
Lymph, poc: 2.5 (ref 0.6–3.4)
MCH: 29.9 pg (ref 27–31.2)
MCHC: 34.2 g/dL (ref 31.8–35.4)
MCV: 87.4 fL (ref 80–97)
MID (CBC): 0.8 (ref 0–0.9)
MPV: 6.4 fL (ref 0–99.8)
PLATELET COUNT, POC: 312 10*3/uL (ref 142–424)
POC Granulocyte: 7.9 — AB (ref 2–6.9)
POC LYMPH PERCENT: 21.9 %L (ref 10–50)
POC MID %: 7.5 %M (ref 0–12)
RBC: 4.69 M/uL (ref 4.04–5.48)
RDW, POC: 12.9 %
WBC: 11.2 10*3/uL — AB (ref 4.6–10.2)

## 2015-07-08 MED ORDER — IPRATROPIUM BROMIDE 0.03 % NA SOLN: 2.0000 | Freq: Two times a day (BID) | NASAL | Status: DC

## 2015-07-08 MED ORDER — ALBUTEROL SULFATE (2.5 MG/3ML) 0.083% IN NEBU
2.5000 mg | INHALATION_SOLUTION | Freq: Once | RESPIRATORY_TRACT | Status: AC
  Administered 2015-07-08: 2.5 mg via RESPIRATORY_TRACT

## 2015-07-08 MED ORDER — PREDNISONE 20 MG PO TABS: ORAL_TABLET | ORAL | Status: DC

## 2015-07-08 NOTE — Progress Notes (Signed)
Subjective:    Patient ID: Erin Peters, female    DOB: 02-Sep-1955, 59 y.o.   MRN: [id number]  07/08/2015  Cough   HPI This 59 y.o. female presents for cough for three weeks.   Started three weeks ago. No fever/chills/sweats.  No headache; mild ear pain; ST mild; +rhinorrhea mild; +nasal congestion green and thick; +PND.  +coughing intermittently.  Drinking a lot at work.  Cough is bad at nighttime.  No sputum production.  No SOB.  Mild wheezing.  +diarrhea all water yesterday; new; started yesterday.  Has also been around friends with GI bugs; Christmas exposure.  Daughter also with diarrhea.    Trial of Nyquil, Dayquil, Mucinex, Mucinex cough liquid, Advil Cold and Sinus, antihistamine.   Review of Systems  Constitutional: Negative for fever, chills, diaphoresis and fatigue.  HENT: Positive for congestion, sore throat and voice change. Negative for ear pain, rhinorrhea, sinus pressure and trouble swallowing.   Respiratory: Positive for cough and wheezing. Negative for shortness of breath.   Gastrointestinal: Positive for diarrhea. Negative for nausea, vomiting and abdominal pain.  Neurological: Negative for dizziness, light-headedness and headaches.    Past Medical History  Diagnosis Date  . Hypertension   . Chronic kidney disease   . Constipation   . Hyperlipidemia   . Arthritis     DDD lumbar   Past Surgical History  Procedure Laterality Date  . Cholecystectomy    . Hernia repair    . Lumbar disc surgery    . Hemorrhoid surgery    . Abdominal hysterectomy      uterine prolapse; ovaries intact.  . Tonsillectomy    . Steroid injection to scar     No Known Allergies Current Outpatient Prescriptions  Medication Sig Dispense Refill  . atorvastatin (LIPITOR) 20 MG tablet TAKE 1 TABLET BY MOUTH EVERY DAY 90 tablet 0  . Biotin 1 MG CAPS Take 1 capsule by mouth daily.    . Cholecalciferol (D3 SUPER STRENGTH) 2000 UNITS CAPS Take by mouth.    . Coenzyme Q10 (CO  Q-10) 200 MG CAPS Take by mouth.    . estradiol (ESTRACE) 0.1 MG/GM vaginal cream Place 1 Applicatorful vaginally once a week. 42.5 g 11  . estradiol (VIVELLE-DOT) 0.1 MG/24HR patch Place 1 patch (0.1 mg total) onto the skin 2 (two) times a week. 8 patch 12  . etodolac (LODINE) 300 MG capsule Reported on 07/08/2015    . Glucosamine-Chondroitin (OSTEO BI-FLEX REGULAR STRENGTH PO) Take by mouth.    . magnesium oxide (MAG-OX) 400 MG tablet Take 400 mg by mouth daily.    . Multiple Vitamin (MULTIVITAMIN) tablet Take 1 tablet by mouth daily.    . Multiple Vitamins-Minerals (HAIR/SKIN/NAILS PO) Take by mouth.    . olmesartan-hydrochlorothiazide (BENICAR HCT) 40-12.5 MG per tablet TAKE 1 TABLET BY MOUTH DAILY 30 tablet 0  . ondansetron (ZOFRAN-ODT) 8 MG disintegrating tablet Take 1 tablet (8 mg total) by mouth every 8 (eight) hours as needed for nausea. 20 tablet 0  . Polyethylene Glycol 3350 (MIRALAX PO) Take by mouth.    Marland Kitchen POTASSIUM GLUCONATE PO Take 99 mg by mouth. Reported on 07/08/2015    . scopolamine (TRANSDERM-SCOP) 1 MG/3DAYS Place 1 patch (1.5 mg total) onto the skin every 3 (three) days. 4 patch 0  . traZODone (DESYREL) 50 MG tablet TAKE 1-2 TABLETS (50-100 MG TOTAL) BY MOUTH AT BEDTIME AS NEEDED FOR SLEEP. 90 tablet 3  . ALPRAZolam (XANAX) 0.5 MG tablet Take 1 tablet (  0.5 mg total) by mouth daily as needed for anxiety. (Patient not taking: Reported on 07/08/2015) 10 tablet 0  . ipratropium (ATROVENT) 0.03 % nasal spray Place 2 sprays into the nose 2 (two) times daily. 30 mL 0  . mupirocin ointment (BACTROBAN) 2 % Apply 1 application topically 3 (three) times daily. (Patient not taking: Reported on 07/08/2015) 30 g 3  . predniSONE (DELTASONE) 20 MG tablet Three tablets daily x 2 days then two tablets daily x 5 days then one tablet daily x 5 days 21 tablet 0   Current Facility-Administered Medications  Medication Dose Route Frequency Provider Last Rate Last Dose  . albuterol (PROVENTIL) (2.5  MG/3ML) 0.083% nebulizer solution 2.5 mg  2.5 mg Nebulization Once Wardell Honour, MD       Social History   Social History  . Marital Status: Married    Spouse Name: N/A  . Number of Children: N/A  . Years of Education: N/A   Occupational History  .  Unemployed    Payment processiong supervisor   Social History Main Topics  . Smoking status: Former Research scientist (life sciences)  . Smokeless tobacco: Former Systems developer    Quit date: 12/06/1976     Comment: not sure of year, she was in her 20's, she was not an everyday smoker   . Alcohol Use: No  . Drug Use: No  . Sexual Activity: Yes   Other Topics Concern  . Not on file   Social History Narrative   Marital status: married x 39 years; happily; no abuse      Children: 2 children (daughter 64, son 50); grandchildrens (2)      Lives:  With husband.      Employment:  [employer] x 14 years;payment processing;  happy moderate.      Tobacco: none; former smoker.      Alcohol: none; rare      Exercise:  Walking/treadmill/elliptical/weights gym at [place].  Goal is 5 days per week; usually 3 days per week.      Seatbelt:  100%      Guns: loaded and secured.               Family History  Problem Relation Age of Onset  . Diabetes Mother   . Heart disease Mother 56    stents x 5; AMI age 76.  Marland Kitchen Hyperlipidemia Mother   . Hypertension Mother   . Dementia Mother   . Heart disease Maternal Grandmother   . Heart disease Maternal Grandfather        Objective:    BP 122/80 mmHg  Pulse 90  Temp(Src) 98.1 F (36.7 C) (Oral)  Resp 18  Ht 5\' 7"  (1.702 m)  Wt 170 lb (77.111 kg)  BMI 26.62 kg/m2  SpO2 98% Physical Exam  Constitutional: She appears well-developed and well-nourished. No distress.  pale  HENT:  Head: Normocephalic.  Right Ear: Tympanic membrane, external ear and ear canal normal.  Left Ear: Tympanic membrane, external ear and ear canal normal.  Nose: Mucosal edema and rhinorrhea present. Right sinus exhibits no maxillary sinus  tenderness and no frontal sinus tenderness. Left sinus exhibits no maxillary sinus tenderness and no frontal sinus tenderness.  Mouth/Throat: Uvula is midline, oropharynx is clear and moist and mucous membranes are normal.  Eyes: Conjunctivae and EOM are normal. Pupils are equal, round, and reactive to light.  Neck: Normal range of motion. Neck supple. No thyromegaly present.  Cardiovascular: Normal rate, regular rhythm and normal heart  sounds.  Exam reveals no gallop and no friction rub.   No murmur heard. Pulmonary/Chest: Effort normal and breath sounds normal. No respiratory distress. She has no wheezes. She has no rales.  Decreased air movement.  Abdominal: Soft. Bowel sounds are normal. She exhibits no distension and no mass. There is no tenderness. There is no rebound and no guarding.  Lymphadenopathy:    She has cervical adenopathy.  Skin: Skin is warm and dry. No rash noted. She is not diaphoretic.   Results for orders placed or performed in visit on 07/08/15  POCT CBC  Result Value Ref Range   WBC 11.2 (A) 4.6 - 10.2 K/uL   Lymph, poc 2.5 0.6 - 3.4   POC LYMPH PERCENT 21.9 10 - 50 %L   MID (cbc) 0.8 0 - 0.9   POC MID % 7.5 0 - 12 %M   POC Granulocyte 7.9 (A) 2 - 6.9   Granulocyte percent 70.6 37 - 80 %G   RBC 4.69 4.04 - 5.48 M/uL   Hemoglobin 14.0 12.2 - 16.2 g/dL   HCT, POC 41.0 37.7 - 47.9 %   MCV 87.4 80 - 97 fL   MCH, POC 29.9 27 - 31.2 pg   MCHC 34.2 31.8 - 35.4 g/dL   RDW, POC 12.9 %   Platelet Count, POC 312 142 - 424 K/uL   MPV 6.4 0 - 99.8 fL   ALBUTEROL NEBULIZER ADMINISTERED.    Assessment & Plan:   1. Acute upper respiratory infection   2. Diarrhea of infectious origin   3. Cough   4. Bronchospasm    -New. -s/p Albuterol in office.  -Rx for Prednisone and Atrovent nasal spray. -continue Mucinex DMI bid. -BRAT diet, hydration.   Orders Placed This Encounter  Procedures  . POCT CBC   Meds ordered this encounter  Medications  . albuterol  (PROVENTIL) (2.5 MG/3ML) 0.083% nebulizer solution 2.5 mg    Sig:   . predniSONE (DELTASONE) 20 MG tablet    Sig: Three tablets daily x 2 days then two tablets daily x 5 days then one tablet daily x 5 days    Dispense:  21 tablet    Refill:  0  . ipratropium (ATROVENT) 0.03 % nasal spray    Sig: Place 2 sprays into the nose 2 (two) times daily.    Dispense:  30 mL    Refill:  0    No Follow-up on file.    Nicholi Ghuman Elayne Guerin, M.D. [facility] [address] [place], [place]  [address] [phone] phone [phone] fax

## 2015-07-08 NOTE — Patient Instructions (Signed)

## 2015-07-12 ENCOUNTER — Telehealth: Payer: Self-pay

## 2015-07-12 DIAGNOSIS — J209 Acute bronchitis, unspecified: Secondary | ICD-10-CM

## 2015-07-12 MED ORDER — AZITHROMYCIN 250 MG PO TABS: ORAL_TABLET | ORAL | Status: AC

## 2015-07-12 NOTE — Telephone Encounter (Signed)
Spoke with pt - she feels her cough is worsening. Becoming more productive. Still wheezing some. Hasn't seen much of a difference taking the prednisone. She felt feverish yesterday and today but hasn't checked temp. Still having a little diarrhea but she states that isn't bothering her.  Will send in zpak - pt has taken this before and does well with it. Advised if she is not starting to turn a corner in 5 days, she needs to return to clinic for further evaluation.

## 2015-07-12 NOTE — Telephone Encounter (Signed)
.   Acute upper respiratory infection   2. Diarrhea of infectious origin   3. Cough   4. Bronchospasm    -New. -s/p Albuterol in office.  -Rx for Prednisone and Atrovent nasal spray. -continue Mucinex DMI bid. -BRAT diet, hydration.

## 2015-07-12 NOTE — Telephone Encounter (Signed)
Pt states she isn't feeling any better and was told to call if that was the case, would like to have an antibiotic or something else called in. Please call [phone]    [facility] [address]

## 2015-07-15 ENCOUNTER — Ambulatory Visit: Payer: Self-pay

## 2015-07-15 ENCOUNTER — Telehealth: Payer: Self-pay | Admitting: *Deleted

## 2015-07-15 ENCOUNTER — Ambulatory Visit (INDEPENDENT_AMBULATORY_CARE_PROVIDER_SITE_OTHER): Payer: Commercial Managed Care - HMO | Admitting: Family Medicine

## 2015-07-15 ENCOUNTER — Ambulatory Visit (INDEPENDENT_AMBULATORY_CARE_PROVIDER_SITE_OTHER): Payer: Commercial Managed Care - HMO

## 2015-07-15 ENCOUNTER — Encounter: Payer: Self-pay | Admitting: Family Medicine

## 2015-07-15 VITALS — BP 126/86 | HR 88 | Temp 98.2°F | Resp 16 | Ht 67.0 in | Wt 163.0 lb

## 2015-07-15 DIAGNOSIS — J0101 Acute recurrent maxillary sinusitis: Secondary | ICD-10-CM | POA: Diagnosis not present

## 2015-07-15 DIAGNOSIS — J9801 Acute bronchospasm: Secondary | ICD-10-CM | POA: Diagnosis not present

## 2015-07-15 DIAGNOSIS — A09 Infectious gastroenteritis and colitis, unspecified: Secondary | ICD-10-CM | POA: Diagnosis not present

## 2015-07-15 LAB — COMPREHENSIVE METABOLIC PANEL
ALBUMIN: 4.4 g/dL (ref 3.6–5.1)
ALT: 16 U/L (ref 6–29)
AST: 16 U/L (ref 10–35)
Alkaline Phosphatase: 74 U/L (ref 33–130)
BILIRUBIN TOTAL: 0.4 mg/dL (ref 0.2–1.2)
BUN: 12 mg/dL (ref 7–25)
CO2: 25 mmol/L (ref 20–31)
CREATININE: 0.71 mg/dL (ref 0.50–1.05)
Calcium: 9.8 mg/dL (ref 8.6–10.4)
Chloride: 100 mmol/L (ref 98–110)
Glucose, Bld: 82 mg/dL (ref 65–99)
Potassium: 4.1 mmol/L (ref 3.5–5.3)
SODIUM: 139 mmol/L (ref 135–146)
TOTAL PROTEIN: 7.4 g/dL (ref 6.1–8.1)

## 2015-07-15 LAB — POCT CBC
GRANULOCYTE PERCENT: 71.1 % (ref 37–80)
HCT, POC: 43.8 % (ref 37.7–47.9)
HEMOGLOBIN: 15.3 g/dL (ref 12.2–16.2)
Lymph, poc: 3.3 (ref 0.6–3.4)
MCH, POC: 30.1 pg (ref 27–31.2)
MCHC: 34.9 g/dL (ref 31.8–35.4)
MCV: 86.4 fL (ref 80–97)
MID (cbc): 0.5 (ref 0–0.9)
MPV: 6.7 fL (ref 0–99.8)
PLATELET COUNT, POC: 318 10*3/uL (ref 142–424)
POC Granulocyte: 9.5 — AB (ref 2–6.9)
POC LYMPH %: 24.9 % (ref 10–50)
POC MID %: 4 %M (ref 0–12)
RBC: 5.07 M/uL (ref 4.04–5.48)
RDW, POC: 13.1 %
WBC: 13.4 10*3/uL — AB (ref 4.6–10.2)

## 2015-07-15 MED ORDER — CEFTRIAXONE SODIUM 1 G IJ SOLR
1.0000 g | Freq: Once | INTRAMUSCULAR | Status: AC
  Administered 2015-07-15: 1 g via INTRAMUSCULAR

## 2015-07-15 MED ORDER — ALBUTEROL SULFATE (2.5 MG/3ML) 0.083% IN NEBU
2.5000 mg | INHALATION_SOLUTION | Freq: Once | RESPIRATORY_TRACT | Status: AC
  Administered 2015-07-15: 2.5 mg via RESPIRATORY_TRACT

## 2015-07-15 MED ORDER — HYDROCODONE-HOMATROPINE 5-1.5 MG/5ML PO SYRP: 5.0000 mL | ORAL_SOLUTION | Freq: Four times a day (QID) | ORAL | Status: DC | PRN

## 2015-07-15 MED ORDER — METHYLPREDNISOLONE SODIUM SUCC 125 MG IJ SOLR
125.0000 mg | Freq: Once | INTRAMUSCULAR | Status: AC
Start: 2015-07-15 — End: 2015-07-15
  Administered 2015-07-15: 125 mg via INTRAMUSCULAR

## 2015-07-15 MED ORDER — IPRATROPIUM BROMIDE 0.02 % IN SOLN
0.5000 mg | Freq: Once | RESPIRATORY_TRACT | Status: AC
Start: 2015-07-15 — End: 2015-07-15
  Administered 2015-07-15: 0.5 mg via RESPIRATORY_TRACT

## 2015-07-15 MED ORDER — CEFDINIR 300 MG PO CAPS: 600.0000 mg | ORAL_CAPSULE | Freq: Every day | ORAL | Status: DC

## 2015-07-15 MED ORDER — ALBUTEROL SULFATE HFA 108 (90 BASE) MCG/ACT IN AERS: 2.0000 | INHALATION_SPRAY | RESPIRATORY_TRACT | Status: DC | PRN

## 2015-07-15 NOTE — Patient Instructions (Signed)
1.  Add Cefdinir 2 tablets daily. 2.  Complete Zpack. 3.  Do not take any Prednisone today; restart tomorrow at 2 tablets daily until gone. 4.  Start Albuterol inhaler 2 puffs four times daily for one week. 5.  Continue Mucinex DM. 6.  Use cough syrup four times daily for cough.   7. Continue Atrovent nasal spray.

## 2015-07-15 NOTE — Progress Notes (Signed)
Subjective:    Patient ID: Erin Peters, female    DOB: 1956-04-13, 60 y.o.   MRN: [id number]  07/15/2015  Cough; Generalized Body Aches; and Fever   HPI This 60 y.o. female presents for one week follow-up for cough, congestion, upper respiratory infection.  Onset of symptoms four weeks ago.  +fever intermittent 2-3 days; 100.2 low grade; fever is new with illness.  +chills/sweats.  +nausea severe three days ago.  +HA today for first time.  R ear congestion; no sore throat.  Head congestion for one week; +rhinorrhea; head congestion for one month; cough for two weeks.  Every day is different.  All colors nasal congestion.  Thick white, green, yellow.  +horrible cough; not sleeping; sputum is green or yellow.  +SOB after coughing; onset two days ago. +wheezing without improvement.  Has four more days of Prednisone.  Zpack called in two days ago.  Prescribed Prednisone, Atrovent, Mucinex DM.  Still having diarrhea; having diarrhea every one hour during the day; small amount.  No heartburn or indigestion.  Missed work yesterday.    Review of Systems  Constitutional: Negative for fever, chills, diaphoresis and fatigue.  HENT: Negative for ear pain, postnasal drip, rhinorrhea, sinus pressure, sore throat and trouble swallowing.   Respiratory: Negative for cough and shortness of breath.   Cardiovascular: Negative for chest pain, palpitations and leg swelling.  Gastrointestinal: Negative for nausea, vomiting, abdominal pain, diarrhea and constipation.    Past Medical History  Diagnosis Date  . Hypertension   . Chronic kidney disease   . Constipation   . Hyperlipidemia   . Arthritis     DDD lumbar   Past Surgical History  Procedure Laterality Date  . Cholecystectomy    . Hernia repair    . Lumbar disc surgery    . Hemorrhoid surgery    . Tonsillectomy    . Steroid injection to scar    . Abdominal hysterectomy      uterine prolapse; ovaries intact.  Marland Kitchen Spine surgery     No Known  Allergies  Social History   Social History  . Marital Status: Married    Spouse Name: N/A  . Number of Children: N/A  . Years of Education: N/A   Occupational History  .  Unemployed    Payment processiong supervisor   Social History Main Topics  . Smoking status: Former Research scientist (life sciences)  . Smokeless tobacco: Former Systems developer    Quit date: 12/06/1976     Comment: not sure of year, she was in her 20's, she was not an everyday smoker   . Alcohol Use: No  . Drug Use: No  . Sexual Activity: Yes   Other Topics Concern  . Not on file   Social History Narrative   Marital status: married x 41 years; happily; no abuse      Children: 2 children (daughter 28, son 82); grandchildrens (2)      Lives:  With husband.      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      Tobacco: none.      Alcohol: none; rare      Exercise:  Walking/treadmill/elliptical/weights gym at [place].  Goal is 5 days per week; usually 3 days per week.      Seatbelt:  100%      Guns: loaded and secured.               Family History  Problem Relation Age  of Onset  . Diabetes Mother   . Heart disease Mother 48    stents x 5; AMI age 65.  Marland Kitchen Hyperlipidemia Mother   . Hypertension Mother   . Dementia Mother   . Heart disease Maternal Grandmother   . Heart disease Maternal Grandfather        Objective:    BP 126/86 mmHg  Pulse 88  Temp(Src) 98.2 F (36.8 C) (Oral)  Resp 16  Ht 5\' 7"  (1.702 m)  Wt 163 lb (73.936 kg)  BMI 25.52 kg/m2  SpO2 95% Physical Exam  Constitutional: She is oriented to person, place, and time. She appears well-developed and well-nourished. No distress.  HENT:  Head: Normocephalic and atraumatic.  Eyes: Conjunctivae are normal. Pupils are equal, round, and reactive to light.  Neck: Normal range of motion. Neck supple.  Cardiovascular: Normal rate, regular rhythm and normal heart sounds.  Exam reveals no gallop and no friction rub.   No murmur  heard. Pulmonary/Chest: Effort normal. She has wheezes. She has no rales.  Neurological: She is alert and oriented to person, place, and time.  Skin: She is not diaphoretic.  Psychiatric: She has a normal mood and affect. Her behavior is normal.  Nursing note and vitals reviewed.  Results for orders placed or performed in visit on 07/15/15  Comprehensive metabolic panel  Result Value Ref Range   Sodium 139 135 - 146 mmol/L   Potassium 4.1 3.5 - 5.3 mmol/L   Chloride 100 98 - 110 mmol/L   CO2 25 20 - 31 mmol/L   Glucose, Bld 82 65 - 99 mg/dL   BUN 12 7 - 25 mg/dL   Creat 0.71 0.50 - 1.05 mg/dL   Total Bilirubin 0.4 0.2 - 1.2 mg/dL   Alkaline Phosphatase 74 33 - 130 U/L   AST 16 10 - 35 U/L   ALT 16 6 - 29 U/L   Total Protein 7.4 6.1 - 8.1 g/dL   Albumin 4.4 3.6 - 5.1 g/dL   Calcium 9.8 8.6 - 10.4 mg/dL  POCT CBC  Result Value Ref Range   WBC 13.4 (A) 4.6 - 10.2 K/uL   Lymph, poc 3.3 0.6 - 3.4   POC LYMPH PERCENT 24.9 10 - 50 %L   MID (cbc) 0.5 0 - 0.9   POC MID % 4.0 0 - 12 %M   POC Granulocyte 9.5 (A) 2 - 6.9   Granulocyte percent 71.1 37 - 80 %G   RBC 5.07 4.04 - 5.48 M/uL   Hemoglobin 15.3 12.2 - 16.2 g/dL   HCT, POC 43.8 37.7 - 47.9 %   MCV 86.4 80 - 97 fL   MCH, POC 30.1 27 - 31.2 pg   MCHC 34.9 31.8 - 35.4 g/dL   RDW, POC 13.1 %   Platelet Count, POC 318 142 - 424 K/uL   MPV 6.7 0 - 99.8 fL   [facility] reading (PRIMARY) by  Dr. Tamala Julian. CXR: RML scarring vs infiltrate      Assessment & Plan:   1. Acute recurrent maxillary sinusitis   2. Bronchospasm   3. Diarrhea of infectious origin     Orders Placed This Encounter  Procedures  . DG Chest 2 View    Standing Status: Future     Number of Occurrences: 1     Standing Expiration Date: 07/14/2016    Order Specific Question:  Reason for Exam (SYMPTOM  OR DIAGNOSIS REQUIRED)    Answer:  cough, fever    Order  Specific Question:  Is the patient pregnant?    Answer:  No    Order Specific Question:  Preferred imaging  location?    Answer:  External  . Comprehensive metabolic panel  . POCT CBC   Meds ordered this encounter  Medications  . albuterol (PROVENTIL) (2.5 MG/3ML) 0.083% nebulizer solution 2.5 mg    Sig:   . ipratropium (ATROVENT) nebulizer solution 0.5 mg    Sig:   . methylPREDNISolone sodium succinate (SOLU-MEDROL) 125 mg/2 mL injection 125 mg    Sig:   . cefTRIAXone (ROCEPHIN) injection 1 g    Sig:   . DISCONTD: cefdinir (OMNICEF) 300 MG capsule    Sig: Take 2 capsules (600 mg total) by mouth daily.    Dispense:  20 capsule    Refill:  0  . albuterol (PROVENTIL HFA;VENTOLIN HFA) 108 (90 Base) MCG/ACT inhaler    Sig: Inhale 2 puffs into the lungs every 4 (four) hours as needed for wheezing or shortness of breath (cough, shortness of breath or wheezing.).    Dispense:  1 Inhaler    Refill:  1  . HYDROcodone-homatropine (HYCODAN) 5-1.5 MG/5ML syrup    Sig: Take 5 mLs by mouth every 6 (six) hours as needed for cough.    Dispense:  180 mL    Refill:  0    No Follow-up on file.    Mariel Lukins Elayne Guerin, M.D. [facility] [address] [place], [place]  [address] [phone] phone [phone] fax

## 2015-07-18 ENCOUNTER — Telehealth: Payer: Self-pay

## 2015-07-18 NOTE — Telephone Encounter (Addendum)
Pt states Dr Tamala Julian wanted an update on her condition and wanted her to know she is still wheezing, coughing and sneezing but isn't running a fever. Please call [phone]

## 2015-07-19 MED ORDER — PREDNISONE 20 MG PO TABS: ORAL_TABLET | ORAL | Status: DC

## 2015-07-19 NOTE — Telephone Encounter (Signed)
Spoke with patient ---- 1. Last fever two days ago.  Diarrhea is better. Appetite is back. Still no energy.  2. Still coughing, wheezing, sneezing.  3. Finished prednisone and zpack.  4. Still taking Omnicef and Mucinex.  Using cough medication as needed; usually at nighttime.  Inhaler four times per day.  Also doing nasal spray twice daily.  Finished Prednisone yesterday.  A/P: refill of prednisone sent to pharmacy.

## 2015-07-20 ENCOUNTER — Ambulatory Visit: Payer: Self-pay

## 2015-07-20 ENCOUNTER — Encounter: Payer: Self-pay | Admitting: Family Medicine

## 2015-07-21 ENCOUNTER — Ambulatory Visit: Payer: Self-pay

## 2015-07-22 ENCOUNTER — Encounter: Payer: Self-pay | Admitting: Family Medicine

## 2015-07-22 ENCOUNTER — Telehealth: Payer: Self-pay

## 2015-07-22 NOTE — Telephone Encounter (Signed)
Pt wanted to talk with somone about that she still has the cough and congestion and would like to know what to do next   Best number [phone]

## 2015-07-22 NOTE — Telephone Encounter (Signed)
1. Add Cefdinir 2 tablets daily. 2. Complete Zpack. 3. Do not take any Prednisone today; restart tomorrow at 2 tablets daily until gone. 4. Start Albuterol inhaler 2 puffs four times daily for one week. 5. Continue Mucinex DM. 6. Use cough syrup four times daily for cough.  7. Continue Atrovent nasal spray.   Spoke with pt, she states everything is better except for the cough. I advised her Dr. Tamala Julian was not here today or this weekend and if she gets worse then to RTC. She wanted to give Dr. Tamala Julian an update and verbalized understanding.

## 2015-07-27 ENCOUNTER — Ambulatory Visit: Payer: Self-pay

## 2015-07-29 ENCOUNTER — Encounter: Payer: Self-pay | Admitting: Family Medicine

## 2015-07-29 ENCOUNTER — Ambulatory Visit (INDEPENDENT_AMBULATORY_CARE_PROVIDER_SITE_OTHER): Payer: Commercial Managed Care - HMO | Admitting: Family Medicine

## 2015-07-29 VITALS — BP 112/78 | HR 87 | Temp 98.6°F | Resp 16 | Ht 67.0 in | Wt 166.0 lb

## 2015-07-29 DIAGNOSIS — E785 Hyperlipidemia, unspecified: Secondary | ICD-10-CM | POA: Diagnosis not present

## 2015-07-29 DIAGNOSIS — Z0000 Encounter for general adult medical examination without abnormal findings: Secondary | ICD-10-CM | POA: Diagnosis not present

## 2015-07-29 DIAGNOSIS — Z131 Encounter for screening for diabetes mellitus: Secondary | ICD-10-CM | POA: Diagnosis not present

## 2015-07-29 DIAGNOSIS — I10 Essential (primary) hypertension: Secondary | ICD-10-CM | POA: Diagnosis not present

## 2015-07-29 LAB — POCT URINALYSIS DIP (MANUAL ENTRY)
BILIRUBIN UA: NEGATIVE
Blood, UA: NEGATIVE
GLUCOSE UA: NEGATIVE
LEUKOCYTES UA: NEGATIVE
NITRITE UA: NEGATIVE
Protein Ur, POC: NEGATIVE
Spec Grav, UA: 1.02
Urobilinogen, UA: 0.2
pH, UA: 7

## 2015-07-29 NOTE — Progress Notes (Signed)
Subjective:    Patient ID: Erin Peters, female    DOB: 1955-09-13, 60 y.o.   MRN: [id number]  07/29/2015  Annual Exam   HPI This 60 y.o. female presents for Complete Physical Examination.  Last physical:   Pap smear:  Hysterectomy; uterine prolapse; ovaries intact; no dysplasia.  No gynecologist.   Mammogram:  06-28-2014; scheduled 08-14-15. Colonoscopy:  10-2005   Bone density:  Never; 2008.  Normal.  TDAP:  2009 Pneumovax: never Zostavax:  never Influenza:  2016 Eye exam:  Glasses; due in 08/2015 Dental exam:  Every six months.   Finished everything five days ago.  No medication currently; 80% better off of medication.  Sleeping better; no energy.  Headache started three days ago.  Stopped Mucinex and everything. Sneezing and rhinorrhea; cough in throat.     Review of Systems  Constitutional: Negative for fever, chills, diaphoresis, activity change, appetite change, fatigue and unexpected weight change.  HENT: Negative for congestion, dental problem, drooling, ear discharge, ear pain, facial swelling, hearing loss, mouth sores, nosebleeds, postnasal drip, rhinorrhea, sinus pressure, sneezing, sore throat, tinnitus, trouble swallowing and voice change.   Eyes: Negative for photophobia, pain, discharge, redness, itching and visual disturbance.  Respiratory: Negative for apnea, cough, choking, chest tightness, shortness of breath, wheezing and stridor.   Cardiovascular: Negative for chest pain, palpitations and leg swelling.  Gastrointestinal: Negative for nausea, vomiting, abdominal pain, diarrhea, constipation, blood in stool, abdominal distention, anal bleeding and rectal pain.  Endocrine: Negative for cold intolerance, heat intolerance, polydipsia, polyphagia and polyuria.  Genitourinary: Negative for dysuria, urgency, frequency, hematuria, flank pain, decreased urine volume, vaginal bleeding, vaginal discharge, enuresis, difficulty urinating, genital sores, vaginal pain,  menstrual problem, pelvic pain and dyspareunia.       Nocturia x 0.  No leakage.    Musculoskeletal: Negative for myalgias, back pain, joint swelling, arthralgias, gait problem, neck pain and neck stiffness.  Skin: Negative for color change, pallor, rash and wound.  Allergic/Immunologic: Negative for environmental allergies, food allergies and immunocompromised state.  Neurological: Negative for dizziness, tremors, seizures, syncope, facial asymmetry, speech difficulty, weakness, light-headedness, numbness and headaches.  Hematological: Negative for adenopathy. Does not bruise/bleed easily.  Psychiatric/Behavioral: Negative for suicidal ideas, hallucinations, behavioral problems, confusion, sleep disturbance, self-injury, dysphoric mood, decreased concentration and agitation. The patient is not nervous/anxious and is not hyperactive.        Bedtime 9:00pm; awakens 6:00am.      Past Medical History  Diagnosis Date  . Hypertension   . Chronic kidney disease   . Constipation   . Hyperlipidemia   . Arthritis     DDD lumbar   Past Surgical History  Procedure Laterality Date  . Cholecystectomy    . Hernia repair    . Lumbar disc surgery    . Hemorrhoid surgery    . Tonsillectomy    . Steroid injection to scar    . Abdominal hysterectomy      uterine prolapse; ovaries intact.  Marland Kitchen Spine surgery     No Known Allergies Current Outpatient Prescriptions  Medication Sig Dispense Refill  . albuterol (PROVENTIL HFA;VENTOLIN HFA) 108 (90 Base) MCG/ACT inhaler Inhale 2 puffs into the lungs every 4 (four) hours as needed for wheezing or shortness of breath (cough, shortness of breath or wheezing.). 1 Inhaler 1  . ALPRAZolam (XANAX) 0.5 MG tablet Take 1 tablet (0.5 mg total) by mouth daily as needed for anxiety. 10 tablet 0  . atorvastatin (LIPITOR) 20 MG tablet TAKE 1  TABLET BY MOUTH EVERY DAY 90 tablet 0  . Biotin 1 MG CAPS Take 1 capsule by mouth daily.    . Cholecalciferol (D3 SUPER STRENGTH)  2000 UNITS CAPS Take by mouth.    . Coenzyme Q10 (CO Q-10) 200 MG CAPS Take by mouth.    . estradiol (ESTRACE) 0.1 MG/GM vaginal cream Place 1 Applicatorful vaginally once a week. 42.5 g 11  . estradiol (VIVELLE-DOT) 0.1 MG/24HR patch Place 1 patch (0.1 mg total) onto the skin 2 (two) times a week. 8 patch 12  . etodolac (LODINE) 300 MG capsule Reported on 07/08/2015    . HYDROcodone-homatropine (HYCODAN) 5-1.5 MG/5ML syrup Take 5 mLs by mouth every 6 (six) hours as needed for cough. 180 mL 0  . magnesium oxide (MAG-OX) 400 MG tablet Take 400 mg by mouth daily.    . Multiple Vitamin (MULTIVITAMIN) tablet Take 1 tablet by mouth daily.    Marland Kitchen olmesartan-hydrochlorothiazide (BENICAR HCT) 40-12.5 MG per tablet TAKE 1 TABLET BY MOUTH DAILY 30 tablet 0  . ondansetron (ZOFRAN-ODT) 8 MG disintegrating tablet Take 1 tablet (8 mg total) by mouth every 8 (eight) hours as needed for nausea. 20 tablet 0  . Polyethylene Glycol 3350 (MIRALAX PO) Take by mouth.    Marland Kitchen POTASSIUM GLUCONATE PO Take 99 mg by mouth. Reported on 07/08/2015    . traZODone (DESYREL) 50 MG tablet TAKE 1-2 TABLETS (50-100 MG TOTAL) BY MOUTH AT BEDTIME AS NEEDED FOR SLEEP. 90 tablet 3   No current facility-administered medications for this visit.   Social History   Social History  . Marital Status: Married    Spouse Name: N/A  . Number of Children: N/A  . Years of Education: N/A   Occupational History  .  Unemployed    Payment processiong supervisor   Social History Main Topics  . Smoking status: Former Research scientist (life sciences)  . Smokeless tobacco: Former Systems developer    Quit date: 12/06/1976     Comment: not sure of year, she was in her 20's, she was not an everyday smoker   . Alcohol Use: No  . Drug Use: No  . Sexual Activity: Yes   Other Topics Concern  . Not on file   Social History Narrative   Marital status: married x 41 years; happily; no abuse      Children: 2 children (daughter 75, son 37); grandchildrens (2)      Lives:  With husband.        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      Tobacco: none.      Alcohol: none; rare      Exercise:  Walking/treadmill/elliptical/weights gym at [place].  Goal is 5 days per week; usually 3 days per week.      Seatbelt:  100%      Guns: loaded and secured.               Family History  Problem Relation Age of Onset  . Diabetes Mother   . Heart disease Mother 58    stents x 5; AMI age 49.  Marland Kitchen Hyperlipidemia Mother   . Hypertension Mother   . Dementia Mother   . Heart disease Maternal Grandmother   . Heart disease Maternal Grandfather        Objective:    BP 112/78 mmHg  Pulse 87  Temp(Src) 98.6 F (37 C) (Oral)  Resp 16  Ht 5\' 7"  (1.702 m)  Wt 166 lb (75.297 kg)  BMI 25.99 kg/m2  SpO2 96% Physical Exam  Constitutional: She is oriented to person, place, and time. She appears well-developed and well-nourished. No distress.  HENT:  Head: Normocephalic and atraumatic.  Right Ear: External ear normal.  Left Ear: External ear normal.  Nose: Nose normal.  Mouth/Throat: Oropharynx is clear and moist.  Eyes: Conjunctivae and EOM are normal. Pupils are equal, round, and reactive to light.  Neck: Normal range of motion and full passive range of motion without pain. Neck supple. No JVD present. Carotid bruit is not present. No thyromegaly present.  Cardiovascular: Normal rate, regular rhythm and normal heart sounds.  Exam reveals no gallop and no friction rub.   No murmur heard. Pulmonary/Chest: Effort normal and breath sounds normal. She has no wheezes. She has no rales.  Abdominal: Soft. Bowel sounds are normal. She exhibits no distension and no mass. There is no tenderness. There is no rebound and no guarding.  Musculoskeletal:       Right shoulder: Normal.       Left shoulder: Normal.       Cervical back: Normal.  Lymphadenopathy:    She has no cervical adenopathy.  Neurological: She is alert and oriented to person, place, and  time. She has normal reflexes. No cranial nerve deficit. She exhibits normal muscle tone. Coordination normal.  Skin: Skin is warm and dry. No rash noted. She is not diaphoretic. No erythema. No pallor.  Psychiatric: She has a normal mood and affect. Her behavior is normal. Judgment and thought content normal.  Nursing note and vitals reviewed.  Results for orders placed or performed in visit on 07/29/15  CBC with Differential/Platelet  Result Value Ref Range   WBC 8.0 4.0 - 10.5 K/uL   RBC 4.80 3.87 - 5.11 MIL/uL   Hemoglobin 14.8 12.0 - 15.0 g/dL   HCT 43.2 36.0 - 46.0 %   MCV 90.0 78.0 - 100.0 fL   MCH 30.8 26.0 - 34.0 pg   MCHC 34.3 30.0 - 36.0 g/dL   RDW 13.8 11.5 - 15.5 %   Platelets 243 150 - 400 K/uL   MPV 10.0 8.6 - 12.4 fL   Neutrophils Relative % 64 43 - 77 %   Neutro Abs 5.1 1.7 - 7.7 K/uL   Lymphocytes Relative 28 12 - 46 %   Lymphs Abs 2.2 0.7 - 4.0 K/uL   Monocytes Relative 7 3 - 12 %   Monocytes Absolute 0.6 0.1 - 1.0 K/uL   Eosinophils Relative 0 0 - 5 %   Eosinophils Absolute 0.0 0.0 - 0.7 K/uL   Basophils Relative 1 0 - 1 %   Basophils Absolute 0.1 0.0 - 0.1 K/uL   Smear Review SEE NOTE   Comprehensive metabolic panel  Result Value Ref Range   Sodium 141 135 - 146 mmol/L   Potassium 4.0 3.5 - 5.3 mmol/L   Chloride 102 98 - 110 mmol/L   CO2 27 20 - 31 mmol/L   Glucose, Bld 96 65 - 99 mg/dL   BUN 13 7 - 25 mg/dL   Creat 0.62 0.50 - 1.05 mg/dL   Total Bilirubin 0.7 0.2 - 1.2 mg/dL   Alkaline Phosphatase 59 33 - 130 U/L   AST 13 10 - 35 U/L   ALT 15 6 - 29 U/L   Total Protein 7.1 6.1 - 8.1 g/dL   Albumin 4.5 3.6 - 5.1 g/dL   Calcium 9.4 8.6 - 10.4 mg/dL  Hemoglobin A1c  Result Value Ref  Range   Hgb A1c MFr Bld 6.2 (H) <5.7 %   Mean Plasma Glucose 131 (H) <117 mg/dL  Lipid panel  Result Value Ref Range   Cholesterol 181 125 - 200 mg/dL   Triglycerides 167 (H) <150 mg/dL   HDL 55 >=46 mg/dL   Total CHOL/HDL Ratio 3.3 <=5.0 Ratio   VLDL 33 (H) <30  mg/dL   LDL Cholesterol 93 <130 mg/dL  TSH  Result Value Ref Range   TSH 0.305 (L) 0.350 - 4.500 uIU/mL  POCT urinalysis dipstick  Result Value Ref Range   Color, UA yellow yellow   Clarity, UA clear clear   Glucose, UA negative negative   Bilirubin, UA large (A) negative   Ketones, POC UA negative negative   Spec Grav, UA 1.020    Blood, UA negative negative   pH, UA 7.0    Protein Ur, POC negative negative   Urobilinogen, UA 0.2    Nitrite, UA Negative Negative   Leukocytes, UA Negative Negative       Assessment & Plan:   1. Routine physical examination   2. Hyperlipidemia   3. Essential hypertension   4. Screening for diabetes mellitus     Orders Placed This Encounter  Procedures  . CBC with Differential/Platelet  . Comprehensive metabolic panel    Order Specific Question:  Has the patient fasted?    Answer:  Yes  . Hemoglobin A1c  . Lipid panel    Order Specific Question:  Has the patient fasted?    Answer:  Yes  . TSH  . POCT urinalysis dipstick   No orders of the defined types were placed in this encounter.    Return in about 6 months (around 01/26/2016) for blood pressure, cholesterol.    Kristi Elayne Guerin, M.D. [facility] [address] [place], [place]  [address] [phone] phone [phone] fax

## 2015-07-29 NOTE — Patient Instructions (Signed)

## 2015-08-01 LAB — CBC WITH DIFFERENTIAL/PLATELET
BASOS ABS: 0.1 10*3/uL (ref 0.0–0.1)
Basophils Relative: 1 % (ref 0–1)
Eosinophils Absolute: 0 10*3/uL (ref 0.0–0.7)
Eosinophils Relative: 0 % (ref 0–5)
HEMATOCRIT: 43.2 % (ref 36.0–46.0)
HEMOGLOBIN: 14.8 g/dL (ref 12.0–15.0)
LYMPHS PCT: 28 % (ref 12–46)
Lymphs Abs: 2.2 10*3/uL (ref 0.7–4.0)
MCH: 30.8 pg (ref 26.0–34.0)
MCHC: 34.3 g/dL (ref 30.0–36.0)
MCV: 90 fL (ref 78.0–100.0)
MPV: 10 fL (ref 8.6–12.4)
Monocytes Absolute: 0.6 10*3/uL (ref 0.1–1.0)
Monocytes Relative: 7 % (ref 3–12)
NEUTROS ABS: 5.1 10*3/uL (ref 1.7–7.7)
NEUTROS PCT: 64 % (ref 43–77)
Platelets: 243 10*3/uL (ref 150–400)
RBC: 4.8 MIL/uL (ref 3.87–5.11)
RDW: 13.8 % (ref 11.5–15.5)
WBC: 8 10*3/uL (ref 4.0–10.5)

## 2015-08-01 LAB — COMPREHENSIVE METABOLIC PANEL
ALK PHOS: 59 U/L (ref 33–130)
ALT: 15 U/L (ref 6–29)
AST: 13 U/L (ref 10–35)
Albumin: 4.5 g/dL (ref 3.6–5.1)
BILIRUBIN TOTAL: 0.7 mg/dL (ref 0.2–1.2)
BUN: 13 mg/dL (ref 7–25)
CALCIUM: 9.4 mg/dL (ref 8.6–10.4)
CO2: 27 mmol/L (ref 20–31)
Chloride: 102 mmol/L (ref 98–110)
Creat: 0.62 mg/dL (ref 0.50–1.05)
GLUCOSE: 96 mg/dL (ref 65–99)
Potassium: 4 mmol/L (ref 3.5–5.3)
Sodium: 141 mmol/L (ref 135–146)
Total Protein: 7.1 g/dL (ref 6.1–8.1)

## 2015-08-01 LAB — LIPID PANEL
CHOLESTEROL: 181 mg/dL (ref 125–200)
HDL: 55 mg/dL (ref >=46)
LDL CALC: 93 mg/dL (ref <130)
Total CHOL/HDL Ratio: 3.3 Ratio (ref <=5.0)
Triglycerides: 167 mg/dL — ABNORMAL HIGH (ref <150)
VLDL: 33 mg/dL — AB (ref <30)

## 2015-08-01 LAB — TSH: TSH: 0.305 u[IU]/mL — ABNORMAL LOW (ref 0.350–4.500)

## 2015-08-01 LAB — HEMOGLOBIN A1C
Hgb A1c MFr Bld: 6.2 % — ABNORMAL HIGH (ref <5.7)
Mean Plasma Glucose: 131 mg/dL — ABNORMAL HIGH (ref <117)

## 2015-08-02 NOTE — Telephone Encounter (Signed)
Noted.  Discussed further with patient at visit on 07-29-15.  No further action warranted.

## 2015-08-11 ENCOUNTER — Ambulatory Visit
Admission: RE | Admit: 2015-08-11 | Discharge: 2015-08-11 | Disposition: A | Discharge status: Home / Self Care | Payer: Commercial Managed Care - HMO | Source: Ambulatory Visit

## 2015-08-11 DIAGNOSIS — Z1231 Encounter for screening mammogram for malignant neoplasm of breast: Secondary | ICD-10-CM

## 2015-08-31 NOTE — Telephone Encounter (Signed)
Entered in error

## 2015-09-03 ENCOUNTER — Other Ambulatory Visit: Payer: Self-pay | Admitting: Family Medicine

## 2016-01-31 ENCOUNTER — Ambulatory Visit: Payer: Commercial Managed Care - HMO | Admitting: Family Medicine

## 2016-02-27 ENCOUNTER — Other Ambulatory Visit: Payer: Self-pay

## 2016-02-27 MED ORDER — ATORVASTATIN CALCIUM 20 MG PO TABS: ORAL_TABLET | ORAL | 0 refills | Status: DC

## 2016-03-03 ENCOUNTER — Other Ambulatory Visit: Payer: Self-pay | Admitting: Family Medicine

## 2016-03-07 ENCOUNTER — Other Ambulatory Visit: Payer: Self-pay | Admitting: Family Medicine

## 2016-03-12 ENCOUNTER — Other Ambulatory Visit: Payer: Self-pay | Admitting: Family Medicine

## 2016-03-20 ENCOUNTER — Encounter: Payer: Self-pay | Admitting: Family Medicine

## 2016-03-20 ENCOUNTER — Ambulatory Visit (INDEPENDENT_AMBULATORY_CARE_PROVIDER_SITE_OTHER): Payer: Commercial Managed Care - HMO | Admitting: Family Medicine

## 2016-03-20 VITALS — BP 120/80 | HR 86 | Temp 98.3°F | Resp 16 | Ht 67.0 in | Wt 180.4 lb

## 2016-03-20 DIAGNOSIS — R7302 Impaired glucose tolerance (oral): Secondary | ICD-10-CM

## 2016-03-20 DIAGNOSIS — E7800 Pure hypercholesterolemia, unspecified: Secondary | ICD-10-CM

## 2016-03-20 DIAGNOSIS — R946 Abnormal results of thyroid function studies: Secondary | ICD-10-CM | POA: Diagnosis not present

## 2016-03-20 DIAGNOSIS — J301 Allergic rhinitis due to pollen: Secondary | ICD-10-CM

## 2016-03-20 DIAGNOSIS — I10 Essential (primary) hypertension: Secondary | ICD-10-CM | POA: Diagnosis not present

## 2016-03-20 DIAGNOSIS — Z1211 Encounter for screening for malignant neoplasm of colon: Secondary | ICD-10-CM

## 2016-03-20 DIAGNOSIS — J0111 Acute recurrent frontal sinusitis: Secondary | ICD-10-CM

## 2016-03-20 LAB — CBC WITH DIFFERENTIAL/PLATELET
BASOS PCT: 0 %
Basophils Absolute: 0 cells/uL (ref 0–200)
EOS PCT: 1 %
Eosinophils Absolute: 58 cells/uL (ref 15–500)
HCT: 43.3 % (ref 35.0–45.0)
Hemoglobin: 14.7 g/dL (ref 11.7–15.5)
LYMPHS ABS: 2030 {cells}/uL (ref 850–3900)
LYMPHS PCT: 35 %
MCH: 30.1 pg (ref 27.0–33.0)
MCHC: 33.9 g/dL (ref 32.0–36.0)
MCV: 88.7 fL (ref 80.0–100.0)
MONO ABS: 464 {cells}/uL (ref 200–950)
MPV: 9.7 fL (ref 7.5–12.5)
Monocytes Relative: 8 %
NEUTROS PCT: 56 %
Neutro Abs: 3248 cells/uL (ref 1500–7800)
Platelets: 274 10*3/uL (ref 140–400)
RBC: 4.88 MIL/uL (ref 3.80–5.10)
RDW: 13.4 % (ref 11.0–15.0)
WBC: 5.8 10*3/uL (ref 3.8–10.8)

## 2016-03-20 MED ORDER — AMOXICILLIN 500 MG PO TABS: 1000.0000 mg | ORAL_TABLET | Freq: Two times a day (BID) | ORAL | 0 refills | Status: DC

## 2016-03-20 MED ORDER — TRAZODONE HCL 50 MG PO TABS: ORAL_TABLET | ORAL | 1 refills | Status: DC

## 2016-03-20 MED ORDER — ESTRADIOL 0.1 MG/24HR TD PTTW: 1.0000 | MEDICATED_PATCH | TRANSDERMAL | 12 refills | Status: DC

## 2016-03-20 MED ORDER — ATORVASTATIN CALCIUM 20 MG PO TABS: 20.0000 mg | ORAL_TABLET | Freq: Every day | ORAL | 1 refills | Status: DC

## 2016-03-20 MED ORDER — OLMESARTAN MEDOXOMIL-HCTZ 40-12.5 MG PO TABS: 1.0000 | ORAL_TABLET | Freq: Every day | ORAL | 3 refills | Status: DC

## 2016-03-20 NOTE — Progress Notes (Signed)
Patient ID: Erin Peters, female   DOB: [birth year], 60 y.o.   MRN: [id number]   By signing my name below I, Tereasa Coop, attest that this documentation has been prepared under the direction and in the presence of Wardell Honour MD. Electonically Signed. Tereasa Coop, Scribe 03/20/2016 at 4:50 PM  Subjective:    Patient ID: Erin Peters, female    DOB: 1956-02-27, 60 y.o.   MRN: [id number]  03/20/2016  Follow-up (X 6 MONTH) and Medication Refill (olmesartan HCTZ - PER PATIENT INSURANCE SUGGEST GENERNIC and ALL RXs generic)   HPI Erin Peters is a 60 y.o. female who presents to the [facility] for 6 month follow up reevaluation of HLD and HTN. At pt's last visit her blood sugar was slightly elevated at last visit. Pt does not have a history of glucose intolerance.  Pt reports that she has had yellow nasal discharge for the past week that has turned green within the past day or 2 with post nasal drip and sneezing. Pt developed productive cough yesterday. Pt has been taking OTC cold medication. Pt has also been taking OTC antihistamines.  Pt denies fever. Pt has been having minor diaphoresis, sinus pressure, and an intermittent HA for the past week. Pt also has had a scratchy throat. Pt denies sore throat or ear pain. Pt has had some mild episodes of lightheadedness with cold symptoms.  Pt states she normally exercises 3-5 days a week. Pt uses an elliptical and stationary bike. Pt denies any CP or palpitations while exercising. Pt denies any leg swelling.   Pt was seen by a gastroenterologist this year who started pt on Linzess.   BP Readings from Last 3 Encounters:  03/20/16 120/80  07/29/15 112/78  07/15/15 126/86    Wt Readings from Last 3 Encounters:  03/20/16 180 lb 6.4 oz (81.8 kg)  07/29/15 166 lb (75.3 kg)  07/15/15 163 lb (73.9 kg)    Review of Systems  Constitutional: Positive for diaphoresis. Negative for chills, fatigue and fever.    HENT: Positive for congestion, postnasal drip, sinus pressure and sneezing. Negative for ear pain, rhinorrhea, sore throat and trouble swallowing.   Respiratory: Positive for cough. Negative for apnea, choking, chest tightness, shortness of breath, wheezing and stridor.   Cardiovascular: Negative for chest pain, palpitations and leg swelling.  Gastrointestinal: Negative for abdominal distention, abdominal pain, anal bleeding, blood in stool, constipation, diarrhea, nausea, rectal pain and vomiting.  Endocrine: Negative for cold intolerance, heat intolerance, polydipsia, polyphagia and polyuria.  Neurological: Positive for light-headedness (mild, rare episodes) and headaches (intermittent).    Past Medical History:  Diagnosis Date  . Arthritis    DDD lumbar  . Chronic kidney disease   . Constipation   . Hyperlipidemia   . Hypertension    Past Surgical History:  Procedure Laterality Date  . ABDOMINAL HYSTERECTOMY     uterine prolapse; ovaries intact.  . CHOLECYSTECTOMY    . HEMORRHOID SURGERY    . HERNIA REPAIR    . LUMBAR DISC SURGERY    . SPINE SURGERY    . STEROID INJECTION TO SCAR    . TONSILLECTOMY     No Known Allergies  Social History   Social History  . Marital status: Married    Spouse name: N/A  . Number of children: N/A  . Years of education: N/A   Occupational History  .  Unemployed    Nutritional therapist   Social History Main  Topics  . Smoking status: Former Research scientist (life sciences)  . Smokeless tobacco: Former Systems developer    Quit date: 12/06/1976     Comment: not sure of year, she was in her 20's, she was not an everyday smoker   . Alcohol use No  . Drug use: No  . Sexual activity: Yes   Other Topics Concern  . Not on file   Social History Narrative   Marital status: married x 41 years; happily; no abuse      Children: 2 children (daughter 26, son 69); grandchildrens (2)      Lives:  With husband.      Employment:  [employer] x 16 years;payment  processing;  happy moderate.      Tobacco: none.      Alcohol: none; rare      Exercise:  Walking/treadmill/elliptical/weights gym at [place].  Goal is 5 days per week; usually 3 days per week.      Seatbelt:  100%      Guns: loaded and secured.               Family History  Problem Relation Age of Onset  . Diabetes Mother   . Heart disease Mother 40    stents x 5; AMI age 60.  Marland Kitchen Hyperlipidemia Mother   . Hypertension Mother   . Dementia Mother   . Heart disease Maternal Grandmother   . Heart disease Maternal Grandfather        Objective:    BP 120/80 (BP Location: Right Arm, Patient Position: Sitting, Cuff Size: Large)   Pulse 86   Temp 98.3 F (36.8 C) (Oral)   Resp 16   Ht 5\' 7"  (1.702 m)   Wt 180 lb 6.4 oz (81.8 kg)   SpO2 96%   BMI 28.25 kg/m  Physical Exam  Constitutional: She is oriented to person, place, and time. She appears well-developed and well-nourished. No distress.  HENT:  Head: Normocephalic and atraumatic.  Right Ear: Hearing, tympanic membrane, external ear and ear canal normal.  Left Ear: Hearing, tympanic membrane, external ear and ear canal normal.  Nose: Mucosal edema and rhinorrhea present. Right sinus exhibits no maxillary sinus tenderness and no frontal sinus tenderness. Left sinus exhibits no maxillary sinus tenderness and no frontal sinus tenderness.  Mouth/Throat: Oropharynx is clear and moist.  Eyes: Conjunctivae and EOM are normal. Pupils are equal, round, and reactive to light.  Neck: Normal range of motion. Neck supple. Carotid bruit is not present. No thyromegaly present.  Cardiovascular: Normal rate, regular rhythm, normal heart sounds and intact distal pulses.  Exam reveals no gallop and no friction rub.   No murmur heard. Pulmonary/Chest: Effort normal and breath sounds normal. No accessory muscle usage. She has no decreased breath sounds. She has no wheezes. She has no rales.  Abdominal: Soft. Bowel sounds are normal. She exhibits no  distension and no mass. There is no tenderness. There is no rebound and no guarding.  Lymphadenopathy:    She has no cervical adenopathy.  Neurological: She is alert and oriented to person, place, and time. No cranial nerve deficit.  Skin: Skin is warm and dry. No rash noted. She is not diaphoretic. No erythema. No pallor.  Psychiatric: She has a normal mood and affect. Her behavior is normal.        Assessment & Plan:   1. Essential hypertension   2. Pure hypercholesterolemia   3. Glucose intolerance (impaired glucose tolerance)   4. Thyroid function study  abnormality   5. Acute recurrent frontal sinusitis   6. Seasonal allergic rhinitis due to pollen   7. Colon cancer screening     Orders Placed This Encounter  Procedures  . CBC with Differential/Platelet  . Comprehensive metabolic panel    Order Specific Question:   Has the patient fasted?    Answer:   Yes  . Lipid panel    Order Specific Question:   Has the patient fasted?    Answer:   Yes  . Hemoglobin A1c  . TSH  . POC Hemoccult Bld/Stl (3-Cd Home Screen)    Standing Status:   Future    Number of Occurrences:   1    Standing Expiration Date:   03/20/2017   Meds ordered this encounter  Medications  . LINZESS 145 MCG CAPS capsule  . traZODone (DESYREL) 50 MG tablet    Sig: TAKE 1-2 TABLETS (50-100 MG TOTAL) BY MOUTH AT BEDTIME AS NEEDED FOR SLEEP.    Dispense:  90 tablet    Refill:  1  . estradiol (VIVELLE-DOT) 0.1 MG/24HR patch    Sig: Place 1 patch (0.1 mg total) onto the skin 2 (two) times a week.    Dispense:  8 patch    Refill:  12  . olmesartan-hydrochlorothiazide (BENICAR HCT) 40-12.5 MG tablet    Sig: Take 1 tablet by mouth daily.    Dispense:  90 tablet    Refill:  3    GENERIC EQUVALENT  . atorvastatin (LIPITOR) 20 MG tablet    Sig: Take 1 tablet (20 mg total) by mouth daily.    Dispense:  90 tablet    Refill:  1  . amoxicillin (AMOXIL) 500 MG tablet    Sig: Take 2 tablets (1,000 mg total) by  mouth 2 (two) times daily.    Dispense:  40 tablet    Refill:  0    Return in about 6 months (around 09/17/2016) for complete physical examiniation.   I personally performed the services described in this documentation, which was scribed in my presence. The recorded information has been reviewed and considered.  Wilbon Obenchain Elayne Guerin, M.D. [facility] [address] [place], [place]  [address] [phone] phone [phone] fax

## 2016-03-21 LAB — HEMOGLOBIN A1C
HEMOGLOBIN A1C: 5.6 % (ref <5.7)
MEAN PLASMA GLUCOSE: 114 mg/dL

## 2016-03-21 LAB — LIPID PANEL
Cholesterol: 191 mg/dL (ref 125–200)
HDL: 65 mg/dL (ref >=46)
LDL CALC: 102 mg/dL (ref <130)
Total CHOL/HDL Ratio: 2.9 Ratio (ref <=5.0)
Triglycerides: 120 mg/dL (ref <150)
VLDL: 24 mg/dL (ref <30)

## 2016-03-21 LAB — COMPREHENSIVE METABOLIC PANEL
ALK PHOS: 85 U/L (ref 33–130)
ALT: 14 U/L (ref 6–29)
AST: 19 U/L (ref 10–35)
Albumin: 4.7 g/dL (ref 3.6–5.1)
BILIRUBIN TOTAL: 0.6 mg/dL (ref 0.2–1.2)
BUN: 11 mg/dL (ref 7–25)
CO2: 27 mmol/L (ref 20–31)
CREATININE: 0.67 mg/dL (ref 0.50–1.05)
Calcium: 9.9 mg/dL (ref 8.6–10.4)
Chloride: 103 mmol/L (ref 98–110)
GLUCOSE: 89 mg/dL (ref 65–99)
Potassium: 4 mmol/L (ref 3.5–5.3)
SODIUM: 142 mmol/L (ref 135–146)
Total Protein: 8 g/dL (ref 6.1–8.1)

## 2016-03-21 LAB — TSH: TSH: 0.68 m[IU]/L

## 2016-03-30 LAB — POC HEMOCCULT BLD/STL (HOME/3-CARD/SCREEN)
Card #3 Fecal Occult Blood, POC: NEGATIVE
FECAL OCCULT BLD: NEGATIVE
Fecal Occult Blood, POC: NEGATIVE

## 2016-04-05 ENCOUNTER — Other Ambulatory Visit: Payer: Self-pay | Admitting: Family Medicine

## 2016-04-06 ENCOUNTER — Other Ambulatory Visit: Payer: Self-pay

## 2016-04-06 NOTE — Telephone Encounter (Signed)
Spoke with pharmacist.  Rx sent 03/20/2016. He states, pharmacy error.  Rx on file and he will refill. This request is denied.... Already filled.

## 2016-04-27 DIAGNOSIS — R7302 Impaired glucose tolerance (oral): Secondary | ICD-10-CM | POA: Insufficient documentation

## 2016-04-27 DIAGNOSIS — I10 Essential (primary) hypertension: Secondary | ICD-10-CM | POA: Insufficient documentation

## 2016-04-27 DIAGNOSIS — J301 Allergic rhinitis due to pollen: Secondary | ICD-10-CM | POA: Insufficient documentation

## 2016-04-27 DIAGNOSIS — E7800 Pure hypercholesterolemia, unspecified: Secondary | ICD-10-CM | POA: Insufficient documentation

## 2016-04-28 ENCOUNTER — Other Ambulatory Visit: Payer: Self-pay | Admitting: Family Medicine

## 2016-06-25 ENCOUNTER — Encounter: Payer: Self-pay | Admitting: Family Medicine

## 2016-07-06 MED ORDER — ATORVASTATIN CALCIUM 20 MG PO TABS: 20.0000 mg | ORAL_TABLET | Freq: Every day | ORAL | 1 refills | Status: DC

## 2016-07-16 ENCOUNTER — Encounter: Payer: Self-pay | Admitting: Family Medicine

## 2016-07-16 ENCOUNTER — Other Ambulatory Visit: Payer: Self-pay | Admitting: Family Medicine

## 2016-07-16 DIAGNOSIS — Z1231 Encounter for screening mammogram for malignant neoplasm of breast: Secondary | ICD-10-CM

## 2016-07-23 MED ORDER — ESTRADIOL 0.05 MG/24HR TD PTTW: 1.0000 | MEDICATED_PATCH | TRANSDERMAL | 3 refills | Status: DC

## 2016-08-14 ENCOUNTER — Ambulatory Visit
Admission: RE | Admit: 2016-08-14 | Discharge: 2016-08-14 | Disposition: A | Discharge status: Home / Self Care | Payer: Commercial Managed Care - HMO | Source: Ambulatory Visit | Attending: Family Medicine | Admitting: Family Medicine

## 2016-08-14 DIAGNOSIS — Z1231 Encounter for screening mammogram for malignant neoplasm of breast: Secondary | ICD-10-CM

## 2016-09-07 DIAGNOSIS — Z1211 Encounter for screening for malignant neoplasm of colon: Secondary | ICD-10-CM | POA: Diagnosis not present

## 2016-09-18 ENCOUNTER — Encounter: Payer: Commercial Managed Care - HMO | Admitting: Family Medicine

## 2016-09-20 ENCOUNTER — Ambulatory Visit (INDEPENDENT_AMBULATORY_CARE_PROVIDER_SITE_OTHER): Payer: Commercial Managed Care - HMO | Admitting: Family Medicine

## 2016-09-20 VITALS — BP 150/92 | HR 86 | Temp 98.5°F | Resp 16 | Ht 67.0 in | Wt 192.8 lb

## 2016-09-20 DIAGNOSIS — Z23 Encounter for immunization: Secondary | ICD-10-CM | POA: Diagnosis not present

## 2016-09-20 DIAGNOSIS — F5105 Insomnia due to other mental disorder: Secondary | ICD-10-CM | POA: Diagnosis not present

## 2016-09-20 DIAGNOSIS — Z6830 Body mass index (BMI) 30.0-30.9, adult: Secondary | ICD-10-CM | POA: Diagnosis not present

## 2016-09-20 DIAGNOSIS — R150 Incomplete defecation: Secondary | ICD-10-CM

## 2016-09-20 DIAGNOSIS — R7302 Impaired glucose tolerance (oral): Secondary | ICD-10-CM | POA: Diagnosis not present

## 2016-09-20 DIAGNOSIS — F99 Mental disorder, not otherwise specified: Secondary | ICD-10-CM

## 2016-09-20 DIAGNOSIS — I10 Essential (primary) hypertension: Secondary | ICD-10-CM

## 2016-09-20 DIAGNOSIS — E6609 Other obesity due to excess calories: Secondary | ICD-10-CM | POA: Diagnosis not present

## 2016-09-20 DIAGNOSIS — Z0000 Encounter for general adult medical examination without abnormal findings: Secondary | ICD-10-CM

## 2016-09-20 DIAGNOSIS — E7800 Pure hypercholesterolemia, unspecified: Secondary | ICD-10-CM | POA: Diagnosis not present

## 2016-09-20 DIAGNOSIS — J301 Allergic rhinitis due to pollen: Secondary | ICD-10-CM

## 2016-09-20 DIAGNOSIS — E66811 Obesity, class 1: Secondary | ICD-10-CM

## 2016-09-20 LAB — POCT URINALYSIS DIP (MANUAL ENTRY)
Bilirubin, UA: NEGATIVE
Glucose, UA: NEGATIVE
Ketones, POC UA: NEGATIVE
Leukocytes, UA: NEGATIVE
Nitrite, UA: NEGATIVE
PH UA: 5.5
PROTEIN UA: NEGATIVE
RBC UA: NEGATIVE
SPEC GRAV UA: 1.005
Urobilinogen, UA: 0.2

## 2016-09-20 MED ORDER — OLMESARTAN MEDOXOMIL-HCTZ 40-12.5 MG PO TABS: 1.0000 | ORAL_TABLET | Freq: Every day | ORAL | 3 refills | Status: DC

## 2016-09-20 MED ORDER — ZOSTER VAC RECOMB ADJUVANTED 50 MCG/0.5ML IM SUSR: 1.0000 | Freq: Once | INTRAMUSCULAR | 1 refills | Status: AC

## 2016-09-20 MED ORDER — TRAZODONE HCL 50 MG PO TABS: ORAL_TABLET | ORAL | 1 refills | Status: DC

## 2016-09-20 MED ORDER — ALPRAZOLAM 0.5 MG PO TABS: 0.5000 mg | ORAL_TABLET | Freq: Every day | ORAL | 0 refills | Status: DC | PRN

## 2016-09-20 NOTE — Progress Notes (Signed)
Subjective:    Patient ID: Erin Peters, female    DOB: 1956/06/11, 61 y.o.   MRN: [id number]  09/20/2016  Annual Exam (with pap) and other (if getting any medication use [facility] and [facility])   HPI This 61 y.o. female presents for Complete Physical Examination.  Last physical:  07-29-2015 Pap smear:  hysterectomy Mammogram:  08-14-2016 Colonoscopy:  10-19-2005  Immunization History  Administered Date(s) Administered  . DTaP 07/09/2004  . Influenza Split 04/09/2015  . Influenza Whole 04/08/2013  . Influenza-Unspecified 04/08/2014, 04/08/2016  . Tdap 09/20/2016   Wt Readings from Last 3 Encounters:  10/18/16 191 lb 9.6 oz (86.9 kg)  10/03/16 189 lb (85.7 kg)  09/29/16 191 lb (86.6 kg)   Migraine: history of horrible migraines when younger; Imitrex not effective; then prescribed a different medication.  Mild nausea; +photophobia.   Review of Systems  Constitutional: Negative for activity change, appetite change, chills, diaphoresis, fatigue, fever and unexpected weight change.  HENT: Negative for congestion, dental problem, drooling, ear discharge, ear pain, facial swelling, hearing loss, mouth sores, nosebleeds, postnasal drip, rhinorrhea, sinus pressure, sneezing, sore throat, tinnitus, trouble swallowing and voice change.   Eyes: Negative for photophobia, pain, discharge, redness, itching and visual disturbance.  Respiratory: Negative for apnea, cough, choking, chest tightness, shortness of breath, wheezing and stridor.   Cardiovascular: Negative for chest pain, palpitations and leg swelling.  Gastrointestinal: Positive for nausea. Negative for abdominal distention, abdominal pain, anal bleeding, blood in stool, constipation, diarrhea, rectal pain and vomiting.  Endocrine: Negative for cold intolerance, heat intolerance, polydipsia, polyphagia and polyuria.  Genitourinary: Negative for decreased urine volume, difficulty urinating, dyspareunia, dysuria, enuresis, flank  pain, frequency, genital sores, hematuria, menstrual problem, pelvic pain, urgency, vaginal bleeding, vaginal discharge and vaginal pain.       Nocturia x 1.  Rare leaking.  Musculoskeletal: Negative for arthralgias, back pain, gait problem, joint swelling, myalgias, neck pain and neck stiffness.  Skin: Negative for color change, pallor, rash and wound.  Allergic/Immunologic: Negative for environmental allergies, food allergies and immunocompromised state.  Neurological: Positive for headaches. Negative for dizziness, tremors, seizures, syncope, facial asymmetry, speech difficulty, weakness, light-headedness and numbness.  Hematological: Negative for adenopathy. Does not bruise/bleed easily.  Psychiatric/Behavioral: Negative for agitation, behavioral problems, confusion, decreased concentration, dysphoric mood, hallucinations, self-injury, sleep disturbance and suicidal ideas. The patient is not nervous/anxious and is not hyperactive.        Bedtime 9:00p; wakes up at 6:00a.    Past Medical History:  Diagnosis Date  . Arthritis    DDD lumbar  . Chronic kidney disease   . Constipation   . Hyperlipidemia   . Hypertension   . Migraine   . STD (sexually transmitted disease)    HSV   Past Surgical History:  Procedure Laterality Date  . ABDOMINAL HYSTERECTOMY     uterine prolapse; ovaries intact.  Marland Kitchen BREAST BIOPSY     right breast  . CHOLECYSTECTOMY    . HEMORRHOID SURGERY    . HERNIA REPAIR    . LUMBAR DISC SURGERY    . SPINE SURGERY    . STEROID INJECTION TO SCAR    . TONSILLECTOMY     No Known Allergies  Social History   Social History  . Marital status: Married    Spouse name: N/A  . Number of children: 3  . Years of education: N/A   Occupational History  . employed [employer]  Social History Main Topics  . Smoking status: Former Research scientist (life sciences)  . Smokeless tobacco: Never Used     Comment: not sure of year, she was in her 20's, she  was not an everyday smoker   . Alcohol use No  . Drug use: No  . Sexual activity: Yes    Birth control/ protection: Post-menopausal, Surgical   Other Topics Concern  . Not on file   Social History Narrative   Marital status: married x 42 years; happily; no abuse      Children: 2 children (daughter 50, son 55); grandchildren (32)      Lives:  With husband.      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      Tobacco: none.      Alcohol: none; rare      Exercise:  Walking/treadmill/elliptical/weights gym at [place].  Goal is 5 days per week; usually 3 days per week.      Seatbelt:  100%; no texting      Guns: loaded and secured.               Family History  Problem Relation Age of Onset  . Diabetes Mother   . Heart disease Mother 45    stents x 5; AMI age 67.  Marland Kitchen Hyperlipidemia Mother   . Hypertension Mother   . Dementia Mother   . Heart disease Maternal Grandmother   . Heart disease Maternal Grandfather        Objective:    BP (!) 150/92 (BP Location: Right Arm, Patient Position: Sitting, Cuff Size: Small)   Pulse 86   Temp 98.5 F (36.9 C) (Oral)   Resp 16   Ht 5\' 7"  (1.702 m)   Wt 192 lb 12.8 oz (87.5 kg)   SpO2 98%   BMI 30.20 kg/m  Physical Exam  Constitutional: She is oriented to person, place, and time. She appears well-developed and well-nourished. No distress.  HENT:  Head: Normocephalic and atraumatic.  Right Ear: External ear normal.  Left Ear: External ear normal.  Nose: Nose normal.  Mouth/Throat: Oropharynx is clear and moist.  Eyes: Conjunctivae and EOM are normal. Pupils are equal, round, and reactive to light.  Neck: Normal range of motion and full passive range of motion without pain. Neck supple. No JVD present. Carotid bruit is not present. No thyromegaly present.  Cardiovascular: Normal rate, regular rhythm and normal heart sounds.  Exam reveals no gallop and no friction rub.   No murmur  heard. Pulmonary/Chest: Effort normal and breath sounds normal. She has no wheezes. She has no rales. Right breast exhibits no inverted nipple, no mass, no nipple discharge, no skin change and no tenderness. Left breast exhibits no inverted nipple, no mass, no nipple discharge, no skin change and no tenderness. Breasts are symmetrical.  Abdominal: Soft. Bowel sounds are normal. She exhibits no distension and no mass. There is no tenderness. There is no rebound and no guarding.  Genitourinary: Vagina normal. No labial fusion. There is no rash, tenderness, lesion or injury on the right labia. There is no rash, tenderness, lesion or injury on the left labia. Right adnexum displays no mass, no tenderness and no fullness. Left adnexum displays no mass, no tenderness and no fullness.  Musculoskeletal:       Right shoulder: Normal.       Left shoulder: Normal.       Cervical back: Normal.  Lymphadenopathy:    She has  no cervical adenopathy.  Neurological: She is alert and oriented to person, place, and time. She has normal reflexes. No cranial nerve deficit. She exhibits normal muscle tone. Coordination normal.  Skin: Skin is warm and dry. No rash noted. She is not diaphoretic. No erythema. No pallor.  Psychiatric: She has a normal mood and affect. Her behavior is normal. Judgment and thought content normal.  Nursing note and vitals reviewed.  Depression screen [facility] 2/9 09/29/2016 09/26/2016 09/20/2016 03/20/2016 07/29/2015  Decreased Interest 0 0 0 0 0  Down, Depressed, Hopeless 0 0 0 0 0  PHQ - 2 Score 0 0 0 0 0   Fall Risk  09/29/2016 09/26/2016 09/20/2016 03/20/2016 07/29/2015  Falls in the past year? No No No No Yes  Number falls in past yr: - - - - 1  Injury with Fall? - - - - Yes        Assessment & Plan:   1. Routine physical examination   2. Essential hypertension   3. Acute seasonal allergic rhinitis due to pollen   4. Glucose intolerance (impaired glucose tolerance)   5. Pure  hypercholesterolemia   6. Incomplete defecation   7. Class 1 obesity due to excess calories with serious comorbidity and body mass index (BMI) of 30.0 to 30.9 in adult   8. Need for Tdap vaccination   9. Need for shingles vaccine    -anticipatory guidance --- weight loss, exercise, low-calorie food choices; limit caloric intake to 1500kcal per day. -obtain age appropriate screening labs; obtain chronic disease state labs for management.   -refills provided.   Orders Placed This Encounter  Procedures  . Tdap vaccine greater than or equal to 7yo IM  . CBC with Differential/Platelet  . Comprehensive metabolic panel    Order Specific Question:   Has the patient fasted?    Answer:   Yes  . Lipid panel    Order Specific Question:   Has the patient fasted?    Answer:   Yes  . TSH  . Hemoglobin A1c  . POCT urinalysis dipstick   Meds ordered this encounter  Medications  . Zoster Vac Recomb Adjuvanted (SHINGRIX) 50 MCG SUSR    Sig: Inject 1 Dose into the muscle once.    Dispense:  1 each    Refill:  1  . olmesartan-hydrochlorothiazide (BENICAR HCT) 40-12.5 MG tablet    Sig: Take 1 tablet by mouth daily.    Dispense:  90 tablet    Refill:  3    GENERIC EQUVALENT  . DISCONTD: traZODone (DESYREL) 50 MG tablet    Sig: TAKE 1-2 TABLETS (50-100 MG TOTAL) BY MOUTH AT BEDTIME AS NEEDED FOR SLEEP.    Dispense:  90 tablet    Refill:  1  . ALPRAZolam (XANAX) 0.5 MG tablet    Sig: Take 1 tablet (0.5 mg total) by mouth daily as needed for anxiety.    Dispense:  10 tablet    Refill:  0    Return in about 6 months (around 03/23/2017) for recheck.   Marcques Wrightsman Elayne Guerin, M.D. [facility] [address] [place], [place]  [address] [phone] phone [phone] fax

## 2016-09-20 NOTE — Patient Instructions (Addendum)
Preventive Care 40-64 Years, Female Preventive care refers to lifestyle choices and visits with your health care provider that can promote health and wellness. What does preventive care include?  A yearly physical exam. This is also called an annual well check.  Dental exams once or twice a year.  Routine eye exams. Ask your health care provider how often you should have your eyes checked.  Personal lifestyle choices, including:  Daily care of your teeth and gums.  Regular physical activity.  Eating a healthy diet.  Avoiding tobacco and drug use.  Limiting alcohol use.  Practicing safe sex.  Taking low-dose aspirin daily starting at age 50.  Taking vitamin and mineral supplements as recommended by your health care provider. What happens during an annual well check? The services and screenings done by your health care provider during your annual well check will depend on your age, overall health, lifestyle risk factors, and family history of disease. Counseling  Your health care provider may ask you questions about your:  Alcohol use.  Tobacco use.  Drug use.  Emotional well-being.  Home and relationship well-being.  Sexual activity.  Eating habits.  Work and work environment.  Method of birth control.  Menstrual cycle.  Pregnancy history. Screening  You may have the following tests or measurements:  Height, weight, and BMI.  Blood pressure.  Lipid and cholesterol levels. These may be checked every 5 years, or more frequently if you are over 50 years old.  Skin check.  Lung cancer screening. You may have this screening every year starting at age 55 if you have a 30-pack-year history of smoking and currently smoke or have quit within the past 15 years.  Fecal occult blood test (FOBT) of the stool. You may have this test every year starting at age 50.  Flexible sigmoidoscopy or colonoscopy. You may have a sigmoidoscopy every 5 years or a colonoscopy  every 10 years starting at age 50.  Hepatitis C blood test.  Hepatitis B blood test.  Sexually transmitted disease (STD) testing.  Diabetes screening. This is done by checking your blood sugar (glucose) after you have not eaten for a while (fasting). You may have this done every 1-3 years.  Mammogram. This may be done every 1-2 years. Talk to your health care provider about when you should start having regular mammograms. This may depend on whether you have a family history of breast cancer.  BRCA-related cancer screening. This may be done if you have a family history of breast, ovarian, tubal, or peritoneal cancers.  Pelvic exam and Pap test. This may be done every 3 years starting at age 21. Starting at age 30, this may be done every 5 years if you have a Pap test in combination with an HPV test.  Bone density scan. This is done to screen for osteoporosis. You may have this scan if you are at high risk for osteoporosis. Discuss your test results, treatment options, and if necessary, the need for more tests with your health care provider. Vaccines  Your health care provider may recommend certain vaccines, such as:  Influenza vaccine. This is recommended every year.  Tetanus, diphtheria, and acellular pertussis (Tdap, Td) vaccine. You may need a Td booster every 10 years.  Varicella vaccine. You may need this if you have not been vaccinated.  Zoster vaccine. You may need this after age 60.  Measles, mumps, and rubella (MMR) vaccine. You may need at least one dose of MMR if you were born   in 1957 or later. You may also need a second dose.  Pneumococcal 13-valent conjugate (PCV13) vaccine. You may need this if you have certain conditions and were not previously vaccinated.  Pneumococcal polysaccharide (PPSV23) vaccine. You may need one or two doses if you smoke cigarettes or if you have certain conditions.  Meningococcal vaccine. You may need this if you have certain  conditions.  Hepatitis A vaccine. You may need this if you have certain conditions or if you travel or work in places where you may be exposed to hepatitis A.  Hepatitis B vaccine. You may need this if you have certain conditions or if you travel or work in places where you may be exposed to hepatitis B.  Haemophilus influenzae type b (Hib) vaccine. You may need this if you have certain conditions. Talk to your health care provider about which screenings and vaccines you need and how often you need them. This information is not intended to replace advice given to you by your health care provider. Make sure you discuss any questions you have with your health care provider. Document Released: 07/22/2015 Document Revised: 03/14/2016 Document Reviewed: 04/26/2015 Elsevier Interactive Patient Education  2017 [employer].     IF you received an x-ray today, you will receive an invoice from [facility]. Please contact [facility] at [phone] with questions or concerns regarding your invoice.   IF you received labwork today, you will receive an invoice from [facility]. Please contact [facility] at [phone] with questions or concerns regarding your invoice.   Our billing staff will not be able to assist you with questions regarding bills from these companies.  You will be contacted with the lab results as soon as they are available. The fastest way to get your results is to activate your My Chart account. Instructions are located on the last page of this paperwork. If you have not heard from [place] regarding the results in 2 weeks, please contact this office.

## 2016-09-21 LAB — CBC WITH DIFFERENTIAL/PLATELET
Basophils Absolute: 0 x10E3/uL (ref 0.0–0.2)
Basos: 0 %
EOS (ABSOLUTE): 0 x10E3/uL (ref 0.0–0.4)
Eos: 0 %
Hematocrit: 41.4 % (ref 34.0–46.6)
Hemoglobin: 14.3 g/dL (ref 11.1–15.9)
Immature Grans (Abs): 0 x10E3/uL (ref 0.0–0.1)
Immature Granulocytes: 0 %
Lymphocytes Absolute: 1.6 x10E3/uL (ref 0.7–3.1)
Lymphs: 27 %
MCH: 30 pg (ref 26.6–33.0)
MCHC: 34.5 g/dL (ref 31.5–35.7)
MCV: 87 fL (ref 79–97)
Monocytes Absolute: 0.6 x10E3/uL (ref 0.1–0.9)
Monocytes: 11 %
Neutrophils Absolute: 3.6 x10E3/uL (ref 1.4–7.0)
Neutrophils: 62 %
Platelets: 250 x10E3/uL (ref 150–379)
RBC: 4.76 x10E6/uL (ref 3.77–5.28)
RDW: 13.5 % (ref 12.3–15.4)
WBC: 5.8 x10E3/uL (ref 3.4–10.8)

## 2016-09-21 LAB — HEMOGLOBIN A1C
Est. average glucose Bld gHb Est-mCnc: 114 mg/dL
Hgb A1c MFr Bld: 5.6 % (ref 4.8–5.6)

## 2016-09-21 LAB — TSH: TSH: 0.784 u[IU]/mL (ref 0.450–4.500)

## 2016-09-21 LAB — COMPREHENSIVE METABOLIC PANEL WITH GFR
ALT: 42 IU/L — ABNORMAL HIGH (ref 0–32)
AST: 35 IU/L (ref 0–40)
Albumin/Globulin Ratio: 1.6 (ref 1.2–2.2)
Albumin: 4.6 g/dL (ref 3.6–4.8)
Alkaline Phosphatase: 108 IU/L (ref 39–117)
BUN/Creatinine Ratio: 17 (ref 12–28)
BUN: 10 mg/dL (ref 8–27)
Bilirubin Total: 0.3 mg/dL (ref 0.0–1.2)
CO2: 26 mmol/L (ref 18–29)
Calcium: 9.9 mg/dL (ref 8.7–10.3)
Chloride: 99 mmol/L (ref 96–106)
Creatinine, Ser: 0.59 mg/dL (ref 0.57–1.00)
GFR calc Af Amer: 115 mL/min/1.73 (ref >59)
GFR calc non Af Amer: 100 mL/min/1.73 (ref >59)
Globulin, Total: 2.8 g/dL (ref 1.5–4.5)
Glucose: 99 mg/dL (ref 65–99)
Potassium: 4.8 mmol/L (ref 3.5–5.2)
Sodium: 141 mmol/L (ref 134–144)
Total Protein: 7.4 g/dL (ref 6.0–8.5)

## 2016-09-21 LAB — LIPID PANEL
CHOLESTEROL TOTAL: 197 mg/dL (ref 100–199)
Chol/HDL Ratio: 4.9 ratio units — ABNORMAL HIGH (ref 0.0–4.4)
HDL: 40 mg/dL (ref >39)
LDL CALC: 83 mg/dL (ref 0–99)
Triglycerides: 371 mg/dL — ABNORMAL HIGH (ref 0–149)
VLDL Cholesterol Cal: 74 mg/dL — ABNORMAL HIGH (ref 5–40)

## 2016-09-25 ENCOUNTER — Telehealth: Payer: Self-pay | Admitting: Family Medicine

## 2016-09-25 NOTE — Telephone Encounter (Signed)
Pt came in on 09/20/16 and got a tetanus shot and she already had a headache from Tuesday being exposed to chemicals, but she still has a severe headache and is now running a fever from 100-102 since Friday and is alternating ibuprophen and alieve and would like to have something else called in stronger for head ache   Best number [phone]

## 2016-09-26 ENCOUNTER — Emergency Department (HOSPITAL_COMMUNITY)
Admission: EM | Admit: 2016-09-26 | Discharge: 2016-09-26 | Disposition: A | Discharge status: Home / Self Care | Payer: Commercial Managed Care - HMO | Attending: Emergency Medicine | Admitting: Emergency Medicine

## 2016-09-26 ENCOUNTER — Ambulatory Visit (INDEPENDENT_AMBULATORY_CARE_PROVIDER_SITE_OTHER): Payer: Commercial Managed Care - HMO

## 2016-09-26 ENCOUNTER — Ambulatory Visit (INDEPENDENT_AMBULATORY_CARE_PROVIDER_SITE_OTHER): Payer: Commercial Managed Care - HMO | Admitting: Family Medicine

## 2016-09-26 ENCOUNTER — Encounter (HOSPITAL_COMMUNITY): Payer: Self-pay

## 2016-09-26 ENCOUNTER — Emergency Department (HOSPITAL_COMMUNITY): Payer: Commercial Managed Care - HMO

## 2016-09-26 VITALS — BP 134/86 | HR 103 | Temp 99.6°F | Resp 16 | Ht 67.0 in | Wt 192.2 lb

## 2016-09-26 DIAGNOSIS — J111 Influenza due to unidentified influenza virus with other respiratory manifestations: Secondary | ICD-10-CM | POA: Diagnosis not present

## 2016-09-26 DIAGNOSIS — R509 Fever, unspecified: Secondary | ICD-10-CM

## 2016-09-26 DIAGNOSIS — N189 Chronic kidney disease, unspecified: Secondary | ICD-10-CM | POA: Insufficient documentation

## 2016-09-26 DIAGNOSIS — R519 Headache, unspecified: Secondary | ICD-10-CM

## 2016-09-26 DIAGNOSIS — Z87891 Personal history of nicotine dependence: Secondary | ICD-10-CM | POA: Insufficient documentation

## 2016-09-26 DIAGNOSIS — Z79899 Other long term (current) drug therapy: Secondary | ICD-10-CM | POA: Insufficient documentation

## 2016-09-26 DIAGNOSIS — R059 Cough, unspecified: Secondary | ICD-10-CM

## 2016-09-26 DIAGNOSIS — I129 Hypertensive chronic kidney disease with stage 1 through stage 4 chronic kidney disease, or unspecified chronic kidney disease: Secondary | ICD-10-CM | POA: Diagnosis not present

## 2016-09-26 DIAGNOSIS — G4489 Other headache syndrome: Secondary | ICD-10-CM | POA: Diagnosis not present

## 2016-09-26 DIAGNOSIS — R05 Cough: Secondary | ICD-10-CM

## 2016-09-26 DIAGNOSIS — R51 Headache: Secondary | ICD-10-CM | POA: Diagnosis not present

## 2016-09-26 LAB — POCT CBC
Granulocyte percent: 81 %G — AB (ref 37–80)
HCT, POC: 38.2 % (ref 37.7–47.9)
HEMOGLOBIN: 13.4 g/dL (ref 12.2–16.2)
Lymph, poc: 1.2 (ref 0.6–3.4)
MCH, POC: 29.9 pg (ref 27–31.2)
MCHC: 35 g/dL (ref 31.8–35.4)
MCV: 85.2 fL (ref 80–97)
MID (cbc): 0.3 (ref 0–0.9)
MPV: 7 fL (ref 0–99.8)
PLATELET COUNT, POC: 172 10*3/uL (ref 142–424)
POC Granulocyte: 6.4 (ref 2–6.9)
POC LYMPH PERCENT: 15 %L (ref 10–50)
POC MID %: 4 %M (ref 0–12)
RBC: 4.48 M/uL (ref 4.04–5.48)
RDW, POC: 12.2 %
WBC: 7.9 10*3/uL (ref 4.6–10.2)

## 2016-09-26 LAB — POCT INFLUENZA A/B
INFLUENZA A, POC: POSITIVE — AB
INFLUENZA B, POC: NEGATIVE

## 2016-09-26 MED ORDER — FENTANYL CITRATE (PF) 100 MCG/2ML IJ SOLN
100.0000 ug | Freq: Once | INTRAMUSCULAR | Status: AC
  Administered 2016-09-26: 100 ug via INTRAVENOUS
  Filled 2016-09-26: qty 2

## 2016-09-26 MED ORDER — ACETAMINOPHEN 325 MG PO TABS
650.0000 mg | ORAL_TABLET | Freq: Once | ORAL | Status: AC | PRN
  Administered 2016-09-26: 650 mg via ORAL
  Filled 2016-09-26: qty 2

## 2016-09-26 MED ORDER — PROCHLORPERAZINE EDISYLATE 5 MG/ML IJ SOLN
10.0000 mg | Freq: Once | INTRAMUSCULAR | Status: AC
  Administered 2016-09-26: 10 mg via INTRAVENOUS
  Filled 2016-09-26: qty 2

## 2016-09-26 MED ORDER — IBUPROFEN 800 MG PO TABS: 800.0000 mg | ORAL_TABLET | Freq: Three times a day (TID) | ORAL | 0 refills | Status: DC | PRN

## 2016-09-26 MED ORDER — DIPHENHYDRAMINE HCL 50 MG/ML IJ SOLN
25.0000 mg | Freq: Once | INTRAMUSCULAR | Status: AC
  Administered 2016-09-26: 25 mg via INTRAVENOUS
  Filled 2016-09-26: qty 1

## 2016-09-26 MED ORDER — ACETAMINOPHEN-CODEINE 120-12 MG/5ML PO SOLN: 10.0000 mL | ORAL | 0 refills | Status: DC | PRN

## 2016-09-26 MED ORDER — SODIUM CHLORIDE 0.9 % IV BOLUS (SEPSIS)
1000.0000 mL | Freq: Once | INTRAVENOUS | Status: AC
  Administered 2016-09-26: 1000 mL via INTRAVENOUS

## 2016-09-26 MED ORDER — KETOROLAC TROMETHAMINE 30 MG/ML IJ SOLN
30.0000 mg | Freq: Once | INTRAMUSCULAR | Status: AC
Start: 2016-09-26 — End: 2016-09-26
  Administered 2016-09-26: 30 mg via INTRAVENOUS
  Filled 2016-09-26: qty 1

## 2016-09-26 MED ORDER — DEXAMETHASONE SODIUM PHOSPHATE 10 MG/ML IJ SOLN
10.0000 mg | Freq: Once | INTRAMUSCULAR | Status: AC
  Administered 2016-09-26: 10 mg via INTRAVENOUS
  Filled 2016-09-26: qty 1

## 2016-09-26 NOTE — ED Provider Notes (Signed)
[facility] DEPT Provider Note   CSN: [id number] Arrival date & time: 09/26/16  1242     History   Chief Complaint Chief Complaint  Patient presents with  . headache/ fever/ chills    HPI Erin Peters is a 61 y.o. female.  HPI Patient presents to the emergency department with headache, fever, cough, over the last 5 days.  The patient states the headache actually started a week ago.  Patient states that she is exposed to chemicals at work and then it started with headache shortly thereafter.  Patient states that nothing seems make the condition better or worse.  She states she was recently seen in urgent care and tested positive for influenza a patient was sent here for further evaluation and care.  She did take some Tylenol with minimal relief of her symptomsThe patient denies chest pain, shortness of breath, blurred vision, neck pain, weakness, numbness, dizziness, anorexia, edema, abdominal pain, nausea, vomiting, diarrhea, rash, back pain, dysuria, hematemesis, bloody stool, near syncope, or syncope. Past Medical History:  Diagnosis Date  . Arthritis    DDD lumbar  . Chronic kidney disease   . Constipation   . Hyperlipidemia   . Hypertension   . Migraine     Patient Active Problem List   Diagnosis Date Noted  . Essential hypertension 04/27/2016  . Pure hypercholesterolemia 04/27/2016  . Glucose intolerance (impaired glucose tolerance) 04/27/2016  . Acute seasonal allergic rhinitis due to pollen 04/27/2016  . Fecal incontinence 07/15/2013  . Unspecified constipation 07/15/2013    Past Surgical History:  Procedure Laterality Date  . ABDOMINAL HYSTERECTOMY     uterine prolapse; ovaries intact.  Marland Kitchen BREAST BIOPSY     right breast  . CHOLECYSTECTOMY    . HEMORRHOID SURGERY    . HERNIA REPAIR    . LUMBAR DISC SURGERY    . SPINE SURGERY    . STEROID INJECTION TO SCAR    . TONSILLECTOMY      OB History    No data available       Home Medications     Prior to Admission medications   Medication Sig Start Date End Date Taking? Authorizing Provider  ALPRAZolam Duanne Moron) 0.5 MG tablet Take 1 tablet (0.5 mg total) by mouth daily as needed for anxiety. 09/20/16  Yes Wardell Honour, MD  atorvastatin (LIPITOR) 20 MG tablet Take 1 tablet (20 mg total) by mouth daily. 07/06/16  Yes Wardell Honour, MD  Cholecalciferol (D3 SUPER STRENGTH) 2000 UNITS CAPS Take 1 capsule by mouth daily.    Yes Historical Provider, MD  Coenzyme Q10 (CO Q-10) 200 MG CAPS Take 1 capsule by mouth daily.    Yes Historical Provider, MD  estradiol (VIVELLE-DOT) 0.05 MG/24HR patch Place 1 patch (0.05 mg total) onto the skin 2 (two) times a week. 07/23/16  Yes Wardell Honour, MD  etodolac (LODINE) 300 MG capsule Take 300 mg by mouth daily. Reported on 07/08/2015 12/29/13  Yes Historical Provider, MD  LINZESS 145 MCG CAPS capsule Take 145 mcg by mouth daily.  03/06/16  Yes Historical Provider, MD  magnesium oxide (MAG-OX) 400 MG tablet Take 400 mg by mouth daily.   Yes Historical Provider, MD  Multiple Vitamin (MULTIVITAMIN) tablet Take 1 tablet by mouth daily.   Yes Historical Provider, MD  olmesartan-hydrochlorothiazide (BENICAR HCT) 40-12.5 MG tablet Take 1 tablet by mouth daily. 09/20/16  Yes Wardell Honour, MD  ondansetron (ZOFRAN-ODT) 8 MG disintegrating tablet Take 1 tablet (8 mg total)  by mouth every 8 (eight) hours as needed for nausea. 03/09/15  Yes Wardell Honour, MD  POTASSIUM GLUCONATE PO Take 99 mg by mouth daily. Reported on 07/08/2015   Yes Historical Provider, MD  traZODone (DESYREL) 50 MG tablet TAKE 1-2 TABLETS (50-100 MG TOTAL) BY MOUTH AT BEDTIME AS NEEDED FOR SLEEP. 09/20/16  Yes Wardell Honour, MD    Family History Family History  Problem Relation Age of Onset  . Diabetes Mother   . Heart disease Mother 62    stents x 5; AMI age 35.  Marland Kitchen Hyperlipidemia Mother   . Hypertension Mother   . Dementia Mother   . Heart disease Maternal Grandmother   . Heart disease  Maternal Grandfather     Social History Social History  Substance Use Topics  . Smoking status: Former Research scientist (life sciences)  . Smokeless tobacco: Former Systems developer    Quit date: 12/06/1976     Comment: not sure of year, she was in her 20's, she was not an everyday smoker   . Alcohol use No     Allergies   Patient has no known allergies.   Review of Systems Review of Systems All other systems negative except as documented in the HPI. All pertinent positives and negatives as reviewed in the HPI.  Physical Exam Updated Vital Signs BP 118/75   Pulse 95   Temp 99.8 F (37.7 C)   Resp 16   SpO2 99%   Physical Exam  Constitutional: She is oriented to person, place, and time. She appears well-developed and well-nourished. No distress.  HENT:  Head: Normocephalic and atraumatic.  Mouth/Throat: Oropharynx is clear and moist.  Eyes: Pupils are equal, round, and reactive to light.  Neck: Normal range of motion. Neck supple. No spinous process tenderness present. No neck rigidity. Normal range of motion present.  Cardiovascular: Normal rate, regular rhythm and normal heart sounds.  Exam reveals no gallop and no friction rub.   No murmur heard. Pulmonary/Chest: Effort normal and breath sounds normal. No respiratory distress. She has no wheezes.  Abdominal: Soft. Bowel sounds are normal. She exhibits no distension. There is no tenderness.  Neurological: She is alert and oriented to person, place, and time. She exhibits normal muscle tone. Coordination normal.  Skin: Skin is warm and dry. Capillary refill takes less than 2 seconds. No rash noted. No erythema.  Psychiatric: She has a normal mood and affect. Her behavior is normal.  Nursing note and vitals reviewed.    ED Treatments / Results  Labs (all labs ordered are listed, but only abnormal results are displayed) Labs Reviewed - No data to display  EKG  EKG Interpretation None       Radiology Dg Chest 2 View  Result Date:  09/26/2016 CLINICAL DATA:  Cough, wheezing. EXAM: CHEST  2 VIEW COMPARISON:  Radiographs of July 15, 2015 FINDINGS: The heart size and mediastinal contours are within normal limits. Both lungs are clear. No pneumothorax or pleural effusion is noted. The visualized skeletal structures are unremarkable. IMPRESSION: No active cardiopulmonary disease. Electronically Signed   By: Marijo Conception, M.D.   On: 09/26/2016 11:57   Ct Head Wo Contrast  Result Date: 09/26/2016 CLINICAL DATA:  Headache for 1 week EXAM: CT HEAD WITHOUT CONTRAST TECHNIQUE: Contiguous axial images were obtained from the base of the skull through the vertex without intravenous contrast. COMPARISON:  None. FINDINGS: Brain: No evidence of acute infarction, hemorrhage, hydrocephalus, extra-axial collection or mass lesion/mass effect. Vascular: No hyperdense vessel  or unexpected calcification. Skull: Normal. Negative for fracture or focal lesion. Sinuses/Orbits: No acute finding. Other: None. IMPRESSION: No acute intracranial pathology. Electronically Signed   By: Marybelle Killings M.D.   On: 09/26/2016 16:14    Procedures Procedures (including critical care time)  Medications Ordered in ED Medications  acetaminophen (TYLENOL) tablet 650 mg (650 mg Oral Given 09/26/16 1507)  sodium chloride 0.9 % bolus 1,000 mL (0 mLs Intravenous Stopped 09/26/16 1753)  ketorolac (TORADOL) 30 MG/ML injection 30 mg (30 mg Intravenous Given 09/26/16 1757)  prochlorperazine (COMPAZINE) injection 10 mg (10 mg Intravenous Given 09/26/16 1757)  fentaNYL (SUBLIMAZE) injection 100 mcg (100 mcg Intravenous Given 09/26/16 1809)  diphenhydrAMINE (BENADRYL) injection 25 mg (25 mg Intravenous Given 09/26/16 1810)  dexamethasone (DECADRON) injection 10 mg (10 mg Intravenous Given 09/26/16 1809)     Initial Impression / Assessment and Plan / ED Course  I have reviewed the triage vital signs and the nursing notes.  Pertinent labs & imaging results that were available  during my care of the patient were reviewed by me and considered in my medical decision making (see chart for details).   patient most likely has headache related to the chemical exposure and the flu.  The patient will be treated for this.  She has no nuchal rigidity at this time after fluids and medication.  She is feeling substantially better.  The patient's vital signs have improved.  The patient is advised to follow-up with her primary care Dr. told to return here as needed.  Patient agrees the plan.  All questions were answered  Final Clinical Impressions(s) / ED Diagnoses   Final diagnoses:  None    New Prescriptions New Prescriptions   No medications on file     Dalia Heading, PA-C 09/26/16 1931    Julianne Rice, MD 09/26/16 [phone]

## 2016-09-26 NOTE — Patient Instructions (Addendum)
Please go to the ED for further evaluation    IF you received an x-ray today, you will receive an invoice from [facility]. Please contact [facility] at [phone] with questions or concerns regarding your invoice.   IF you received labwork today, you will receive an invoice from [facility]. Please contact [facility] at [phone] with questions or concerns regarding your invoice.   Our billing staff will not be able to assist you with questions regarding bills from these companies.  You will be contacted with the lab results as soon as they are available. The fastest way to get your results is to activate your My Chart account. Instructions are located on the last page of this paperwork. If you have not heard from [place] regarding the results in 2 weeks, please contact this office.

## 2016-09-26 NOTE — ED Notes (Signed)
One unsuccessful attempt to start her iv  She does not want a hand iv

## 2016-09-26 NOTE — Telephone Encounter (Signed)
Will you rx something else?

## 2016-09-26 NOTE — ED Notes (Signed)
The  Pts headache is better alert oriented skin warm and dry  She wants to go home

## 2016-09-26 NOTE — Discharge Instructions (Signed)
Return here as needed.  Follow-up with your primary doctor, increase your fluid intake and rest as much as possible

## 2016-09-26 NOTE — ED Triage Notes (Addendum)
Patient complains of ongoing headache x 8 days and developed fever and chills this past Friday. Went to her MD today and tested positive for influenza A. Alert and oriented, NAD. Remote hx of migraines and complains of photophobia with headache. Mask placed in  triage

## 2016-09-26 NOTE — Progress Notes (Signed)
Erin Peters is a 61 y.o. female who presents to Primary Care at [facility] today for:  1.  Headache. Patient presents with 1 week of severe headache. Patient states that Tuesday after being exposed to chemical odor at work she developed a headache. Headache has continually worsened. Friday of last week patient started to developing fevers. She has had a daily fever ranging from 100-103F. She has been taking Aleve or ibuprofen every 4 hours for temperature. Medications do not help with headache. She is having associated chills as well. Denies any congestion, rhinorrhea, sinus pressure, nausea, and vomiting. States that she has a dry cough.  History of migraines but states that this does not feel like a migraine. Headache is severe, constant, and generalized to the whole head. Denies any vision changes. Does have photophobia. She states that any movement she does worsens the head pain. She has been feeling dizzy and off balance due to headache.   Denies sick contacts, mental status changes. Last took medication at 6am this morning. Up to date on vaccines including the flu.   ROS as above.  Pertinently, no chest pain, palpitations, SOB, Abd pain, N/V/D.   PMH reviewed. Patient is a nonsmoker.   Past Medical History:  Diagnosis Date  . Arthritis    DDD lumbar  . Chronic kidney disease   . Constipation   . Hyperlipidemia   . Hypertension   . Migraine    Past Surgical History:  Procedure Laterality Date  . ABDOMINAL HYSTERECTOMY     uterine prolapse; ovaries intact.  Marland Kitchen BREAST BIOPSY     right breast  . CHOLECYSTECTOMY    . HEMORRHOID SURGERY    . HERNIA REPAIR    . LUMBAR DISC SURGERY    . SPINE SURGERY    . STEROID INJECTION TO SCAR    . TONSILLECTOMY      Medications reviewed. Current Outpatient Prescriptions  Medication Sig Dispense Refill  . ALPRAZolam (XANAX) 0.5 MG tablet Take 1 tablet (0.5 mg total) by mouth daily as needed for anxiety. 10 tablet 0  . atorvastatin  (LIPITOR) 20 MG tablet Take 1 tablet (20 mg total) by mouth daily. 90 tablet 1  . Cholecalciferol (D3 SUPER STRENGTH) 2000 UNITS CAPS Take by mouth.    . Coenzyme Q10 (CO Q-10) 200 MG CAPS Take by mouth.    . estradiol (VIVELLE-DOT) 0.05 MG/24HR patch Place 1 patch (0.05 mg total) onto the skin 2 (two) times a week. 24 patch 3  . etodolac (LODINE) 300 MG capsule Reported on 07/08/2015    . LINZESS 145 MCG CAPS capsule     . magnesium oxide (MAG-OX) 400 MG tablet Take 400 mg by mouth daily.    . Multiple Vitamin (MULTIVITAMIN) tablet Take 1 tablet by mouth daily.    Marland Kitchen olmesartan-hydrochlorothiazide (BENICAR HCT) 40-12.5 MG tablet Take 1 tablet by mouth daily. 90 tablet 3  . ondansetron (ZOFRAN-ODT) 8 MG disintegrating tablet Take 1 tablet (8 mg total) by mouth every 8 (eight) hours as needed for nausea. 20 tablet 0  . Polyethylene Glycol 3350 (MIRALAX PO) Take by mouth.    Marland Kitchen POTASSIUM GLUCONATE PO Take 99 mg by mouth. Reported on 07/08/2015    . traZODone (DESYREL) 50 MG tablet TAKE 1-2 TABLETS (50-100 MG TOTAL) BY MOUTH AT BEDTIME AS NEEDED FOR SLEEP. 90 tablet 1   No current facility-administered medications for this visit.      Physical Exam:  BP 134/86   Pulse (!) 103  Temp 99.6 F (37.6 C) (Oral)   Resp 16   Ht 5\' 7"  (1.702 m)   Wt 192 lb 3.2 oz (87.2 kg)   SpO2 96%   BMI 30.10 kg/m   Vital signs reviewed.  Gen:  Alert, sitting in dark room with eyes closed, in some distress HEENT: EOMI,  MMM, PERRLA but with associated pain, sinus tenderness in frontal area and maxillary bilaterally, TMs normal bilaterally Neck: supple, full ROM but with associated worsening of headache, no adenopathy, no masses Pulm: Decreased air movement, cough with deep inspiration, normal work of breathing, no wheezes or rales appreciated. Cardiac: tachycardia, regular rhythm without murmur auscultated. Abd:  Soft/nondistended/nontender.  Neuro: CNs grossly intact, A&Ox3, +kerning's sign on left.    Results for orders placed or performed in visit on 09/26/16  POCT Influenza A/B  Result Value Ref Range   Influenza A, POC Positive (A) Negative   Influenza B, POC Negative Negative  POCT CBC  Result Value Ref Range   WBC 7.9 4.6 - 10.2 K/uL   Lymph, poc 1.2 0.6 - 3.4   POC LYMPH PERCENT 15.0 10 - 50 %L   MID (cbc) 0.3 0 - 0.9   POC MID % 4.0 0 - 12 %M   POC Granulocyte 6.4 2 - 6.9   Granulocyte percent 81.0 (A) 37 - 80 %G   RBC 4.48 4.04 - 5.48 M/uL   Hemoglobin 13.4 12.2 - 16.2 g/dL   HCT, POC 38.2 37.7 - 47.9 %   MCV 85.2 80 - 97 fL   MCH, POC 29.9 27 - 31.2 pg   MCHC 35.0 31.8 - 35.4 g/dL   RDW, POC 12.2 %   Platelet Count, POC 172 142 - 424 K/uL   MPV 7.0 0 - 99.8 fL    Dg Chest 2 View  Result Date: 09/26/2016 CLINICAL DATA:  Cough, wheezing. EXAM: CHEST  2 VIEW COMPARISON:  Radiographs of July 15, 2015 FINDINGS: The heart size and mediastinal contours are within normal limits. Both lungs are clear. No pneumothorax or pleural effusion is noted. The visualized skeletal structures are unremarkable. IMPRESSION: No active cardiopulmonary disease. Electronically Signed   By: Marijo Conception, M.D.   On: 09/26/2016 11:57    Assessment and Plan:  1. Fever, unspecified fever cause Patient with persistent intractable fevers for over a week. Has associated chills. Differential is broad but concern for some type of bacterial infection. Flu positive. CXR without signs of pneumonia or other lung processes. CBC without leukocytosis. - DG Chest 2 View; Future - POCT Influenza A/B - POCT CBC  2. Cough Cough only with deep inspirations. Dry cough. Flu negative. CXR unremarkable. No known lung disease. Cough could be associated with an irritant exposure at work.  - DG Chest 2 View; Future - POCT Influenza A/B - POCT CBC  3. Intractable headache, unspecified chronicity pattern, unspecified headache type Intractable headache. Concern for acute bacterial meningitis.Also with fevers  and chills this makes differential high. Headache is severe and generalized. Does not fit pattern of typical migraine. Headache is not relieved with Aleve or ibuprofen that patient has been taking. Also on Ddx could be a sinusitis, however patient has had no congestive symptoms. Vitals in clinic are stable. Flu was positive but doesn't explain this sever headache. Will send to ED for further evaluation. Would recommend head imaging. - POCT CBC   Diagnosis and plan along were discussed in detail with this patient today. The patient verbalized understanding and agreed with  the plan. Patient advised to go to ED for further evaluation.   Luiz Blare, DO 09/26/2016, 11:13 AM PGY-3, [place]

## 2016-09-28 MED ORDER — BUTALBITAL-APAP-CAFFEINE 50-325-40 MG PO TABS: 1.0000 | ORAL_TABLET | Freq: Four times a day (QID) | ORAL | 0 refills | Status: DC | PRN

## 2016-09-28 NOTE — Telephone Encounter (Signed)
I have reviewed Dr. Cindra Presume office note from 09/26/16; I also have reviewed ED notes from 09/26/16 including CT scan results.  A/P: Influenza with acute headache: s/p CT head negative; s/p CXR; s/p labs.  Called in Fioricet to [place].  Left voicemail advising pt.

## 2016-09-29 ENCOUNTER — Ambulatory Visit (INDEPENDENT_AMBULATORY_CARE_PROVIDER_SITE_OTHER): Payer: Commercial Managed Care - HMO | Admitting: Family Medicine

## 2016-09-29 VITALS — BP 132/82 | HR 92 | Temp 98.5°F | Resp 16 | Ht 67.5 in | Wt 191.0 lb

## 2016-09-29 DIAGNOSIS — N898 Other specified noninflammatory disorders of vagina: Secondary | ICD-10-CM

## 2016-09-29 DIAGNOSIS — N939 Abnormal uterine and vaginal bleeding, unspecified: Secondary | ICD-10-CM | POA: Diagnosis not present

## 2016-09-29 MED ORDER — VALACYCLOVIR HCL 1 G PO TABS: 1000.0000 mg | ORAL_TABLET | Freq: Three times a day (TID) | ORAL | 0 refills | Status: DC

## 2016-09-29 NOTE — Patient Instructions (Addendum)
We are going to refer you to to a gynecologist for the bleeding.  Take the Valtrex 3 times a day for the next 7 days.  We are doing blood work and urine tests today.  I'm glad to hear that otherwise your headache is feeling a little better.    IF you received an x-ray today, you will receive an invoice from [facility]. Please contact [facility] at [phone] with questions or concerns regarding your invoice.   IF you received labwork today, you will receive an invoice from [facility]. Please contact [facility] at [phone] with questions or concerns regarding your invoice.   Our billing staff will not be able to assist you with questions regarding bills from these companies.  You will be contacted with the lab results as soon as they are available. The fastest way to get your results is to activate your My Chart account. Instructions are located on the last page of this paperwork. If you have not heard from [place] regarding the results in 2 weeks, please contact this office.

## 2016-09-29 NOTE — Progress Notes (Signed)
Erin Peters is a 61 y.o. female who presents to Primary Care at [facility] today for vaginal bleeding:  1.  Vaginal bleeding:  [name] with past history of hysterectomy in 1989 and no vaginal bleeding since then who wears a pessary and presents with 2 day history of vaginal bleeding. Patient states she had a pelvic examination about a week ago with her primary care provider. About a week after that she removed her pessary due to some feelings of discomfort and she noticed some dried blood. She attempted to reinsert her pessary. The next day she felt more out of leaking and noted fresh blood around her pessary. She also has noted some "irritation" around the introitus of her vagina. This is been going on for several days as well. She is sexually active and states she's monogamous with her husband.  States that although her sex drive has decreased, his has not. She denies any vaginal burning or itching. She is unclear if she has any discharge because as noted above she does have some bleeding. No abdominal pain. No nausea vomiting. Eating and drinking well.  2.  Hospital follow-up:  Patient is infectious in here 3 days ago for fever and headache. She tested positive for flu. As we are not able to break her headache she was transferred to the emergency department. She had negative head CT. She continued to have the headache after being discharged home and her primary care provider center and some Fioricet. Her she has not had a headache since then. It's been about 24 hours. Still feels weak but states her strength is slowly coming back. No falls  ROS as above.  Pertinently, no chest pain, palpitations, SOB, Fever, Chills, Abd pain, N/V/D.   PMH reviewed. Patient is a nonsmoker.   Past Medical History:  Diagnosis Date  . Arthritis    DDD lumbar  . Chronic kidney disease   . Constipation   . Hyperlipidemia   . Hypertension   . Migraine    Past Surgical History:  Procedure Laterality  Date  . ABDOMINAL HYSTERECTOMY     uterine prolapse; ovaries intact.  Marland Kitchen BREAST BIOPSY     right breast  . CHOLECYSTECTOMY    . HEMORRHOID SURGERY    . HERNIA REPAIR    . LUMBAR DISC SURGERY    . SPINE SURGERY    . STEROID INJECTION TO SCAR    . TONSILLECTOMY      Medications reviewed. Current Outpatient Prescriptions  Medication Sig Dispense Refill  . acetaminophen-codeine 120-12 MG/5ML solution Take 10 mLs by mouth every 4 (four) hours as needed for moderate pain (and cough). 120 mL 0  . ALPRAZolam (XANAX) 0.5 MG tablet Take 1 tablet (0.5 mg total) by mouth daily as needed for anxiety. 10 tablet 0  . atorvastatin (LIPITOR) 20 MG tablet Take 1 tablet (20 mg total) by mouth daily. 90 tablet 1  . butalbital-acetaminophen-caffeine (FIORICET, ESGIC) 50-325-40 MG tablet Take 1-2 tablets by mouth every 6 (six) hours as needed for headache. 30 tablet 0  . Cholecalciferol (D3 SUPER STRENGTH) 2000 UNITS CAPS Take 1 capsule by mouth daily.     . Coenzyme Q10 (CO Q-10) 200 MG CAPS Take 1 capsule by mouth daily.     Marland Kitchen estradiol (VIVELLE-DOT) 0.05 MG/24HR patch Place 1 patch (0.05 mg total) onto the skin 2 (two) times a week. 24 patch 3  . etodolac (LODINE) 300 MG capsule Take 300 mg by mouth daily. Reported on 07/08/2015    .  ibuprofen (ADVIL,MOTRIN) 800 MG tablet Take 1 tablet (800 mg total) by mouth every 8 (eight) hours as needed. 21 tablet 0  . LINZESS 145 MCG CAPS capsule Take 145 mcg by mouth daily.     . magnesium oxide (MAG-OX) 400 MG tablet Take 400 mg by mouth daily.    . Multiple Vitamin (MULTIVITAMIN) tablet Take 1 tablet by mouth daily.    Marland Kitchen olmesartan-hydrochlorothiazide (BENICAR HCT) 40-12.5 MG tablet Take 1 tablet by mouth daily. 90 tablet 3  . ondansetron (ZOFRAN-ODT) 8 MG disintegrating tablet Take 1 tablet (8 mg total) by mouth every 8 (eight) hours as needed for nausea. 20 tablet 0  . POTASSIUM GLUCONATE PO Take 99 mg by mouth daily. Reported on 07/08/2015    . traZODone  (DESYREL) 50 MG tablet TAKE 1-2 TABLETS (50-100 MG TOTAL) BY MOUTH AT BEDTIME AS NEEDED FOR SLEEP. 90 tablet 1   No current facility-administered medications for this visit.      Physical Exam:  BP 132/82   Pulse 92   Temp 98.5 F (36.9 C)   Resp 16   Ht 5' 7.5" (1.715 m)   Wt 191 lb (86.6 kg)   SpO2 97%   BMI 29.47 kg/m  Gen:  Alert, cooperative patient who appears stated age in no acute distress.  Vital signs reviewed. Head: [place]/AT.   Eyes:  EOMI, PERRL.   Ears:  External ears WNL, Bilateral TM's normal without retraction, redness or bulging. Nose:  Septum midline  Mouth:  MMM, tonsils non-erythematous, non-edematous.   Neck:  Supple.  GYN:  External genitalia within 2 mm in diameter erosion noted posterior Right labia right at the introitus.  Vaginal mucosa pink, moist.  Does have some old blood in vault plus some brownish discharge.  She also has notable intra-vaginal lesions with some dried blood which I was not able to remove with a Fox swab. These were nontender. She had 3-4 these. I was able to tell if these were chronic abrasions from pessary use. I was not able to note a cervix.   Exts: Non edematous BL  LE, warm and well perfused.  Neuro: Alert and oriented x 4.  No focal deficits.   Assessment and Plan:  1.  Vaginal lesion: - I am concerned for herpes based on exam and history. This lesion is highly consistent with herpetic lesion. -This would be a new lesion for her meaning newly acquired herpes. -Right approach the subject with her, she states she would be disappointed but not entirely surprised. As noted above she reports that she has been married 18 years but her husband still has a strong sex drive while she does not.  - Did discuss this could be HSV-1 (though this is less likely).  Does'nt appear to be irritation from friction. - Is still sexually active.  - obtaining HSV1-2 testing.    2.  Vaginal bleeding:   - new problem for patient - she is unclear complete  hysterectomy. She states that the GYN who completed her hysterectomy continue to do Pap smears which makes me think this is supracervical. -However her primary care providers since then have not been doing Pap smears because of her hysterectomy. -She is also unable to remember if it was a vaginal or abdominal hysterectomy. -In visit on about the presence of the cervix and was not able to confirm this on exam we will refer her to GYN.  - also like to see the lesions in her vagina biopsy.  #3.  Influenza: -Most likely cause for her recent headache, fever, weakness. -HA better with Fioricet.   -She is slowly getting better from weakness standpoint. -If this starts to worsen should follow-up with [place].

## 2016-10-01 LAB — GC/CHLAMYDIA PROBE AMP

## 2016-10-02 ENCOUNTER — Telehealth: Payer: Self-pay | Admitting: Family Medicine

## 2016-10-02 LAB — HSV(HERPES SIMPLEX VRS) I + II AB-IGM: HSVI/II COMB AB IGM: 1.3 ratio — AB (ref 0.00–0.90)

## 2016-10-02 LAB — HSV(HERPES SIMPLEX VRS) I + II AB-IGG
HSV 1 Glycoprotein G Ab, IgG: 7.83 index — ABNORMAL HIGH (ref 0.00–0.90)
HSV 2 Glycoprotein G Ab, IgG: <0.91 index (ref 0.00–0.90)

## 2016-10-02 LAB — HIV ANTIBODY (ROUTINE TESTING W REFLEX): HIV Screen 4th Generation wRfx: NONREACTIVE

## 2016-10-02 LAB — RPR: RPR: NONREACTIVE

## 2016-10-02 NOTE — Telephone Encounter (Signed)
Called and discussed results with patient.  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  Patient feels better from prior flu and from genital lesion standpoint.    Treated and considering as HSV-1.  Sounds like this is NOT primary infection -- patient remembered "something similar" occurring "many years" ago.    Still with vaginal bleeding.  She has appt with GYN tomorrow.  All questions answered, she appreciated call.

## 2016-10-03 ENCOUNTER — Ambulatory Visit (INDEPENDENT_AMBULATORY_CARE_PROVIDER_SITE_OTHER): Payer: Commercial Managed Care - HMO | Admitting: Gynecology

## 2016-10-03 ENCOUNTER — Encounter: Payer: Self-pay | Admitting: Gynecology

## 2016-10-03 VITALS — BP 122/78 | Ht 68.0 in | Wt 189.0 lb

## 2016-10-03 DIAGNOSIS — B373 Candidiasis of vulva and vagina: Secondary | ICD-10-CM

## 2016-10-03 DIAGNOSIS — A6004 Herpesviral vulvovaginitis: Secondary | ICD-10-CM | POA: Insufficient documentation

## 2016-10-03 DIAGNOSIS — N765 Ulceration of vagina: Secondary | ICD-10-CM

## 2016-10-03 DIAGNOSIS — N760 Acute vaginitis: Secondary | ICD-10-CM

## 2016-10-03 DIAGNOSIS — N950 Postmenopausal bleeding: Secondary | ICD-10-CM | POA: Diagnosis not present

## 2016-10-03 DIAGNOSIS — N814 Uterovaginal prolapse, unspecified: Secondary | ICD-10-CM | POA: Diagnosis not present

## 2016-10-03 DIAGNOSIS — B3731 Acute candidiasis of vulva and vagina: Secondary | ICD-10-CM

## 2016-10-03 DIAGNOSIS — B9689 Other specified bacterial agents as the cause of diseases classified elsewhere: Secondary | ICD-10-CM

## 2016-10-03 LAB — WET PREP FOR TRICH, YEAST, CLUE: TRICH WET PREP: NONE SEEN

## 2016-10-03 MED ORDER — CLINDAMYCIN PHOSPHATE 2 % VA CREA: 1.0000 | TOPICAL_CREAM | Freq: Every day | VAGINAL | 0 refills | Status: DC

## 2016-10-03 MED ORDER — FLUCONAZOLE 150 MG PO TABS: 150.0000 mg | ORAL_TABLET | Freq: Once | ORAL | 0 refills | Status: AC

## 2016-10-03 NOTE — Patient Instructions (Signed)
Clindamycin vaginal cream What is this medicine? CLINDAMYCIN (Hindman sin) is a lincosamide antibiotic. It is used to treat vaginal infections caused by certain bacteria. This medicine may be used for other purposes; ask your health care provider or pharmacist if you have questions. COMMON BRAND NAME(S): Cleocin, ClindaMax, Clindesse What should I tell my health care provider before I take this medicine? They need to know if you have any of these conditions: -diarrhea -inflammatory bowel disease -kidney or liver disease -stomach problems like colitis -an unusual or allergic reaction to clindamycin, lincomycin, other medicines, foods, dyes or preservatives -pregnant or trying to get pregnant -breast-feeding How should I use this medicine? This medicine is only for use in the vagina. Place in the vagina using the special applicator supplied with the cream. Wash hands before and after use. Fill the applicator with cream. Lie on your back, part and bend your knees. Insert the applicator into the vagina and push the plunger to expel the cream into the vagina. Wash the applicator with warm soapy water and rinse well. Keep this medicine out of the eyes. If you do get any in your eyes rinse out with plenty of cool tap water. Take your medicine at regular intervals. Do not take your medicine more often than directed. Use this medicine for the full course prescribed by your doctor or health care professional, even if you think your condition is better. Do not stop using except on your the advice of your doctor or health care professional. Talk to your pediatrician regarding the use of this medicine in children. Special care may be needed. Overdosage: If you think you have taken too much of this medicine contact a poison control center or emergency room at once. NOTE: This medicine is only for you. Do not share this medicine with others. What if I miss a dose? If you miss a dose, use it as soon as you  can. If it is almost time for your next dose, use only that dose. Do not use double or extra doses. What may interact with this medicine? Interactions are not expected. Do not use any other vaginal products without telling your doctor or health care professional. This list may not describe all possible interactions. Give your health care provider a list of all the medicines, herbs, non-prescription drugs, or dietary supplements you use. Also tell them if you smoke, drink alcohol, or use illegal drugs. Some items may interact with your medicine. What should I watch for while using this medicine? Tell your doctor or health care professional if your symptoms do not start to get better in a few days. Do not use tampons or douches while using this medicine. Do not have sex until you have finished your treatment. Having sex can make the treatment less effective. After you finish treatment, do not use latex condoms for three days. There may still be some medicine in the vagina. This can damage the latex and make the condom less effective at preventing pregnancy. Your clothing may get soiled. To help prevent reinfection, wear freshly washed cotton, not synthetic, underwear. What side effects may I notice from receiving this medicine? Side effects that you should report to your doctor or health care professional as soon as possible: -allergic reactions like skin rash, itching or hives, swelling of the face, lips, or tongue -diarrhea that is watery or severe -fever or chills, sore throat -increased thirst -itching of the vaginal or genital area -pain during sexual intercourse -stomach pain or  cramps -thick white vaginal discharge -unusual bleeding or bruising Side effects that usually do not require medical attention (report to your doctor or health care professional if they continue or are bothersome): -nausea, vomiting This list may not describe all possible side effects. Call your doctor for medical  advice about side effects. You may report side effects to FDA at 1-800-FDA-1088. Where should I keep my medicine? Keep out of the reach of children. Store at room temperature between 20 and 25 degrees C (68 and 77 degrees F). Do not freeze. Throw away any unused medicine after the expiration date. NOTE: This sheet is a summary. It may not cover all possible information. If you have questions about this medicine, talk to your doctor, pharmacist, or health care provider.  2018 Elsevier/Gold Standard (2013-01-29 16:18:30) Bacterial Vaginosis Bacterial vaginosis is a vaginal infection that occurs when the normal balance of bacteria in the vagina is disrupted. It results from an overgrowth of certain bacteria. This is the most common vaginal infection among women ages 70-44. Because bacterial vaginosis increases your risk for STIs (sexually transmitted infections), getting treated can help reduce your risk for chlamydia, gonorrhea, herpes, and HIV (human immunodeficiency virus). Treatment is also important for preventing complications in pregnant women, because this condition can cause an early (premature) delivery. What are the causes? This condition is caused by an increase in harmful bacteria that are normally present in small amounts in the vagina. However, the reason that the condition develops is not fully understood. What increases the risk? The following factors may make you more likely to develop this condition:  Having a new sexual partner or multiple sexual partners.  Having unprotected sex.  Douching.  Having an intrauterine device (IUD).  Smoking.  Drug and alcohol abuse.  Taking certain antibiotic medicines.  Being pregnant. You cannot get bacterial vaginosis from toilet seats, bedding, swimming pools, or contact with objects around you. What are the signs or symptoms? Symptoms of this condition include:  Grey or white vaginal discharge. The discharge can also be watery or  foamy.  A fish-like odor with discharge, especially after sexual intercourse or during menstruation.  Itching in and around the vagina.  Burning or pain with urination. Some women with bacterial vaginosis have no signs or symptoms. How is this diagnosed? This condition is diagnosed based on:  Your medical history.  A physical exam of the vagina.  Testing a sample of vaginal fluid under a microscope to look for a large amount of bad bacteria or abnormal cells. Your health care provider may use a cotton swab or a small wooden spatula to collect the sample. How is this treated? This condition is treated with antibiotics. These may be given as a pill, a vaginal cream, or a medicine that is put into the vagina (suppository). If the condition comes back after treatment, a second round of antibiotics may be needed. Follow these instructions at home: Medicines   Take over-the-counter and prescription medicines only as told by your health care provider.  Take or use your antibiotic as told by your health care provider. Do not stop taking or using the antibiotic even if you start to feel better. General instructions   If you have a female sexual partner, tell her that you have a vaginal infection. She should see her health care provider and be treated if she has symptoms. If you have a female sexual partner, he does not need treatment.  During treatment:  Avoid sexual activity until you finish  treatment.  Do not douche.  Avoid alcohol as directed by your health care provider.  Avoid breastfeeding as directed by your health care provider.  Drink enough water and fluids to keep your urine clear or pale yellow.  Keep the area around your vagina and rectum clean.  Wash the area daily with warm water.  Wipe yourself from front to back after using the toilet.  Keep all follow-up visits as told by your health care provider. This is important. How is this prevented?  Do not  douche.  Wash the outside of your vagina with warm water only.  Use protection when having sex. This includes latex condoms and dental dams.  Limit how many sexual partners you have. To help prevent bacterial vaginosis, it is best to have sex with just one partner (monogamous).  Make sure you and your sexual partner are tested for STIs.  Wear cotton or cotton-lined underwear.  Avoid wearing tight pants and pantyhose, especially during summer.  Limit the amount of alcohol that you drink.  Do not use any products that contain nicotine or tobacco, such as cigarettes and e-cigarettes. If you need help quitting, ask your health care provider.  Do not use illegal drugs. Where to find more information:  Centers for Disease Control and Prevention: AppraiserFraud.fi  American Sexual Health Association (ASHA): www.ashastd.org  [place] Department of Health and Financial controller, Office on Women's Health: DustingSprays.pl or SecuritiesCard.it Contact a health care provider if:  Your symptoms do not improve, even after treatment.  You have more discharge or pain when urinating.  You have a fever.  You have pain in your abdomen.  You have pain during sex.  You have vaginal bleeding between periods. Summary  Bacterial vaginosis is a vaginal infection that occurs when the normal balance of bacteria in the vagina is disrupted.  Because bacterial vaginosis increases your risk for STIs (sexually transmitted infections), getting treated can help reduce your risk for chlamydia, gonorrhea, herpes, and HIV (human immunodeficiency virus). Treatment is also important for preventing complications in pregnant women, because the condition can cause an early (premature) delivery.  This condition is treated with antibiotic medicines. These may be given as a pill, a vaginal cream, or a medicine that is put into the vagina (suppository). This information is not  intended to replace advice given to you by your health care provider. Make sure you discuss any questions you have with your health care provider. Document Released: 06/25/2005 Document Revised: 03/10/2016 Document Reviewed: 03/10/2016 Elsevier Interactive Patient Education  2017 Elsevier Inc. Fluconazole tablets What is this medicine? FLUCONAZOLE (floo KON na zole) is an antifungal medicine. It is used to treat certain kinds of fungal or yeast infections. This medicine may be used for other purposes; ask your health care provider or pharmacist if you have questions. COMMON BRAND NAME(S): Diflucan What should I tell my health care provider before I take this medicine? They need to know if you have any of these conditions: -history of irregular heart beat -kidney disease -an unusual or allergic reaction to fluconazole, other azole antifungals, medicines, foods, dyes, or preservatives -pregnant or trying to get pregnant -breast-feeding How should I use this medicine? Take this medicine by mouth. Follow the directions on the prescription label. Do not take your medicine more often than directed. Talk to your pediatrician regarding the use of this medicine in children. Special care may be needed. This medicine has been used in children as young as 71 months of age. Overdosage: If you  think you have taken too much of this medicine contact a poison control center or emergency room at once. NOTE: This medicine is only for you. Do not share this medicine with others. What if I miss a dose? If you miss a dose, take it as soon as you can. If it is almost time for your next dose, take only that dose. Do not take double or extra doses. What may interact with this medicine? Do not take this medicine with any of the following medications: -astemizole -certain medicines for irregular heart beat like dofetilide, dronedarone, quinidine -cisapride -erythromycin -lomitapide -other medicines that prolong  the QT interval (cause an abnormal heart rhythm) -pimozide -terfenadine -thioridazine -tolvaptan -ziprasidone This medicine may also interact with the following medications: -antiviral medicines for HIV or AIDS -birth control pills -certain antibiotics like rifabutin, rifampin -certain medicines for blood pressure like amlodipine, isradipine, felodipine, hydrochlorothiazide, losartan, nifedipine -certain medicines for cancer like cyclophosphamide, vinblastine, vincristine -certain medicines for cholesterol like atorvastatin, lovastatin, fluvastatin, simvastatin -certain medicines for depression, anxiety, or psychotic disturbances like amitriptyline, midazolam, nortriptyline, triazolam -certain medicines for diabetes like glipizide, glyburide, tolbutamide -certain medicines for pain like alfentanil, fentanyl, methadone -certain medicines for seizures like carbamazepine, phenytoin -certain medicines that treat or prevent blood clots like warfarin -halofantrine -medicines that lower your chance of fighting infection like cyclosporine, prednisone, tacrolimus -NSAIDS, medicines for pain and inflammation, like celecoxib, diclofenac, flurbiprofen, ibuprofen, meloxicam, naproxen -other medicines for fungal infections -sirolimus -theophylline -tofacitinib This list may not describe all possible interactions. Give your health care provider a list of all the medicines, herbs, non-prescription drugs, or dietary supplements you use. Also tell them if you smoke, drink alcohol, or use illegal drugs. Some items may interact with your medicine. What should I watch for while using this medicine? Visit your doctor or health care professional for regular checkups. If you are taking this medicine for a long time you may need blood work. Tell your doctor if your symptoms do not improve. Some fungal infections need many weeks or months of treatment to cure. Alcohol can increase possible damage to your liver.  Avoid alcoholic drinks. If you have a vaginal infection, do not have sex until you have finished your treatment. You can wear a sanitary napkin. Do not use tampons. Wear freshly washed cotton, not synthetic, panties. What side effects may I notice from receiving this medicine? Side effects that you should report to your doctor or health care professional as soon as possible: -allergic reactions like skin rash or itching, hives, swelling of the lips, mouth, tongue, or throat -dark urine -feeling dizzy or faint -irregular heartbeat or chest pain -redness, blistering, peeling or loosening of the skin, including inside the mouth -trouble breathing -unusual bruising or bleeding -vomiting -yellowing of the eyes or skin Side effects that usually do not require medical attention (report to your doctor or health care professional if they continue or are bothersome): -changes in how food tastes -diarrhea -headache -stomach upset or nausea This list may not describe all possible side effects. Call your doctor for medical advice about side effects. You may report side effects to FDA at 1-800-FDA-1088. Where should I keep my medicine? Keep out of the reach of children. Store at room temperature below 30 degrees C (86 degrees F). Throw away any medicine after the expiration date. NOTE: This sheet is a summary. It may not cover all possible information. If you have questions about this medicine, talk to your doctor, pharmacist, or health care provider.  2018 Elsevier/Gold Standard (2013-01-31 19:37:38) Vaginal Yeast infection, Adult Vaginal yeast infection is a condition that causes soreness, swelling, and redness (inflammation) of the vagina. It also causes vaginal discharge. This is a common condition. Some women get this infection frequently. What are the causes? This condition is caused by a change in the normal balance of the yeast (candida) and bacteria that live in the vagina. This change causes  an overgrowth of yeast, which causes the inflammation. What increases the risk? This condition is more likely to develop in:  Women who take antibiotic medicines.  Women who have diabetes.  Women who take birth control pills.  Women who are pregnant.  Women who douche often.  Women who have a weak defense (immune) system.  Women who have been taking steroid medicines for a long time.  Women who frequently wear tight clothing. What are the signs or symptoms? Symptoms of this condition include:  White, thick vaginal discharge.  Swelling, itching, redness, and irritation of the vagina. The lips of the vagina (vulva) may be affected as well.  Pain or a burning feeling while urinating.  Pain during sex. How is this diagnosed? This condition is diagnosed with a medical history and physical exam. This will include a pelvic exam. Your health care provider will examine a sample of your vaginal discharge under a microscope. Your health care provider may send this sample for testing to confirm the diagnosis. How is this treated? This condition is treated with medicine. Medicines may be over-the-counter or prescription. You may be told to use one or more of the following:  Medicine that is taken orally.  Medicine that is applied as a cream.  Medicine that is inserted directly into the vagina (suppository). Follow these instructions at home:  Take or apply over-the-counter and prescription medicines only as told by your health care provider.  Do not have sex until your health care provider has approved. Tell your sex partner that you have a yeast infection. That person should go to his or her health care provider if he or she develops symptoms.  Do not wear tight clothes, such as pantyhose or tight pants.  Avoid using tampons until your health care provider approves.  Eat more yogurt. This may help to keep your yeast infection from returning.  Try taking a sitz bath to help with  discomfort. This is a warm water bath that is taken while you are sitting down. The water should only come up to your hips and should cover your buttocks. Do this 3-4 times per day or as told by your health care provider.  Do not douche.  Wear breathable, cotton underwear.  If you have diabetes, keep your blood sugar levels under control. Contact a health care provider if:  You have a fever.  Your symptoms go away and then return.  Your symptoms do not get better with treatment.  Your symptoms get worse.  You have new symptoms.  You develop blisters in or around your vagina.  You have blood coming from your vagina and it is not your menstrual period.  You develop pain in your abdomen. This information is not intended to replace advice given to you by your health care provider. Make sure you discuss any questions you have with your health care provider. Document Released: 04/04/2005 Document Revised: 12/07/2015 Document Reviewed: 12/27/2014 Elsevier Interactive Patient Education  2017 [employer].

## 2016-10-03 NOTE — Progress Notes (Signed)
   Patient is a 61 year old who is a new patient to our practice that was referred to [place] from her PCPs office as a result of recent post menopausal bleeding. Patient was recently diagnosed with herpes genitalia and had been started on Valtrex which she took only for 2 days and mistakenly was taking ibuprofen instead. Patient has a history of vaginal hysterectomy and as a result of vaginal prolapse she has been using a ring pessary for support. She denies any urinary incontinence. She cleans the pessary once a week but applies no estrogen cream only water. She is on a low dose transdermal estrogen twice a week which her PCP has been tapering down. Patient recently noticed some bleeding under undergone was the reason she was referred to our practice.  Exam: External genitalia small excoriated areas highly suspicious for HSV. When the speculum was introduced in the vagina patient was noted to have vaginal wall prolapse and ulcerated area 2 on the vaginal mucosa. A wet prep was obtained which demonstrated the following:  Few yeast, few clue cell, moderate WBC and many bacteria  Assessment/plan: Vaginal mucosal ulceration probably secondary to pessary. Evidence of bacterial vaginosis and yeast vaginitis. The ulceration may have contributed to some of the bleeding. She will be placed on Cleocin vaginal cream to apply daily at bedtime for one week. She will be prescribed Diflucan 150 mg one by mouth for 1 day. She will refrain from intercourse until I see her in 10 days and not reinsert the pessary until I see her. We will reinspect the area again have the ulceration still present we'll proceed with biopsy. She is married and in a monogamous relationship.

## 2016-10-03 NOTE — Addendum Note (Signed)
Addended by: Nelva Nay on: 10/03/2016 04:00 PM   Modules accepted: Orders

## 2016-10-10 ENCOUNTER — Other Ambulatory Visit: Payer: Self-pay | Admitting: Physician Assistant

## 2016-10-18 ENCOUNTER — Encounter: Payer: Self-pay | Admitting: Gynecology

## 2016-10-18 ENCOUNTER — Ambulatory Visit (INDEPENDENT_AMBULATORY_CARE_PROVIDER_SITE_OTHER): Payer: Commercial Managed Care - HMO | Admitting: Gynecology

## 2016-10-18 VITALS — BP 112/80 | Ht 68.0 in | Wt 191.6 lb

## 2016-10-18 DIAGNOSIS — N8110 Cystocele, unspecified: Secondary | ICD-10-CM

## 2016-10-18 DIAGNOSIS — N950 Postmenopausal bleeding: Secondary | ICD-10-CM | POA: Diagnosis not present

## 2016-10-18 NOTE — Progress Notes (Signed)
   Patient is a 61-year-old was seen as a new patient on March 28 referred by her PCP as a result of postmenopausal bleeding. Patient with prior history of vaginal hysterectomy and had developed a vaginal prolapse and had been using a ring pessary. She is on a low dose estrogen transdermal patch which her PCP has been weaning her off. Removes the pessary once a week to clean and only puts water on a when she inserted. In the last office visit she was noted to have a vaginal wall prolapse and an ulcerated area on 2 areas of the vaginal mucosa and a wet prep demonstrated yeast and BV so she was treated with Cleocin vaginal cream for one week and a prescription Diflucan 150 mg by mouth was provided. She was to refrain from intercourse for 10 days to refer her for follow-up. She is asymptomatic reports no further vaginal bleeding.  Exam: Bartholin urethra Skene was within normal limits Vaginal wall prolapse with normal mucosa and no ulceration no discharge Bimanual exam not done  Assessment/plan: 61 year old with past history of vaginal hysterectomy with years later developing vaginal prolapse doing well with ring pessary had developed an ulceration no longer present and was treated for yeast and BV. I have recommended she use K-Y jelly when she inserts the pessary once a week to eliminate friction and irritation. Her pessary was then placed into the vagina again today.

## 2016-10-24 ENCOUNTER — Telehealth: Payer: Self-pay | Admitting: *Deleted

## 2016-10-24 MED ORDER — ESTRADIOL 10 MCG VA TABS: 1.0000 | ORAL_TABLET | VAGINAL | 11 refills | Status: DC

## 2016-10-24 NOTE — Telephone Encounter (Signed)
Pt called to follow from [place] 10/18/16 using [place] with intercourse as recommended, pt said she had sex the first time after getting the okay from you and no issues. Pt said last night she had sexual intercourse and this am she had vaginal burning, no itching, no discharge, no burning with urination,no pain, no bleeding. She asked why is this happening, vaginal burning has never been a problem before. Pt would like to know you thoughts? Please advise

## 2016-10-24 NOTE — Telephone Encounter (Signed)
Left message for pt to call.

## 2016-10-24 NOTE — Telephone Encounter (Signed)
Pt informed, Rx sent. 

## 2016-10-24 NOTE — Telephone Encounter (Signed)
Since she has been we weanig off the estrogen patch I would recommend she comes off of it altogether and begin using vaginal estrogen tablet Vagifem 10 g apply intravaginal twice a which will help build up her vaginal epithelium so she is not irritated especially during intercourse. She can also use at the time that she has intercourse the KY gel or Astrogel. Both have no alcohol preservatives. If he call and a Vagifem tablet to apply vaginally call and 8 tablets with 11 refills

## 2016-11-02 DIAGNOSIS — M5417 Radiculopathy, lumbosacral region: Secondary | ICD-10-CM | POA: Diagnosis not present

## 2016-11-02 DIAGNOSIS — M48061 Spinal stenosis, lumbar region without neurogenic claudication: Secondary | ICD-10-CM | POA: Diagnosis not present

## 2016-11-02 DIAGNOSIS — M4726 Other spondylosis with radiculopathy, lumbar region: Secondary | ICD-10-CM | POA: Diagnosis not present

## 2016-11-02 DIAGNOSIS — M5136 Other intervertebral disc degeneration, lumbar region: Secondary | ICD-10-CM | POA: Diagnosis not present

## 2016-11-04 MED ORDER — ATORVASTATIN CALCIUM 20 MG PO TABS: 20.0000 mg | ORAL_TABLET | Freq: Every day | ORAL | 3 refills | Status: DC

## 2016-11-15 DIAGNOSIS — M5416 Radiculopathy, lumbar region: Secondary | ICD-10-CM | POA: Diagnosis not present

## 2016-11-21 ENCOUNTER — Encounter: Payer: Self-pay | Admitting: Gynecology

## 2016-12-10 DIAGNOSIS — M48061 Spinal stenosis, lumbar region without neurogenic claudication: Secondary | ICD-10-CM | POA: Diagnosis not present

## 2016-12-10 DIAGNOSIS — M5136 Other intervertebral disc degeneration, lumbar region: Secondary | ICD-10-CM | POA: Diagnosis not present

## 2016-12-10 DIAGNOSIS — M4726 Other spondylosis with radiculopathy, lumbar region: Secondary | ICD-10-CM | POA: Diagnosis not present

## 2016-12-21 DIAGNOSIS — Z23 Encounter for immunization: Secondary | ICD-10-CM | POA: Diagnosis not present

## 2017-01-02 ENCOUNTER — Encounter: Payer: Self-pay | Admitting: Emergency Medicine

## 2017-01-02 ENCOUNTER — Ambulatory Visit (INDEPENDENT_AMBULATORY_CARE_PROVIDER_SITE_OTHER): Payer: 59 | Admitting: Emergency Medicine

## 2017-01-02 VITALS — BP 110/73 | HR 97 | Temp 98.6°F | Resp 16 | Ht 67.0 in | Wt 193.4 lb

## 2017-01-02 DIAGNOSIS — R05 Cough: Secondary | ICD-10-CM

## 2017-01-02 DIAGNOSIS — J040 Acute laryngitis: Secondary | ICD-10-CM

## 2017-01-02 DIAGNOSIS — J069 Acute upper respiratory infection, unspecified: Secondary | ICD-10-CM | POA: Diagnosis not present

## 2017-01-02 DIAGNOSIS — R059 Cough, unspecified: Secondary | ICD-10-CM

## 2017-01-02 MED ORDER — AZITHROMYCIN 250 MG PO TABS: ORAL_TABLET | ORAL | 0 refills | Status: DC

## 2017-01-02 MED ORDER — PROMETHAZINE-CODEINE 6.25-10 MG/5ML PO SYRP: 5.0000 mL | ORAL_SOLUTION | Freq: Every evening | ORAL | 0 refills | Status: DC | PRN

## 2017-01-02 MED ORDER — PREDNISONE 20 MG PO TABS: 40.0000 mg | ORAL_TABLET | Freq: Every day | ORAL | 0 refills | Status: AC

## 2017-01-02 NOTE — Progress Notes (Signed)
Erin Peters Coll 61 y.o.   Chief Complaint  Patient presents with  . Cough    NONPRODUCTIVE x 1 week  . Sore Throat    x 3 days    HISTORY OF PRESENT ILLNESS: This is a 61 y.o. female complaining of 1 week h/o non-productive cough and sore throat; lost her voice 3 days ago.  Cough  This is a new problem. The current episode started 1 to 4 weeks ago. The problem has been gradually worsening. The cough is non-productive. Associated symptoms include nasal congestion and a sore throat. Pertinent negatives include no chest pain, chills, fever, hemoptysis, myalgias, rash, rhinorrhea or wheezing. She has tried OTC cough suppressant (Mucinex) for the symptoms. The treatment provided mild relief.     Prior to Admission medications   Medication Sig Start Date End Date Taking? Authorizing Provider  ALPRAZolam Duanne Moron) 0.5 MG tablet Take 1 tablet (0.5 mg total) by mouth daily as needed for anxiety. 09/20/16  Yes Wardell Honour, MD  atorvastatin (LIPITOR) 20 MG tablet Take 1 tablet (20 mg total) by mouth daily. 11/04/16  Yes Wardell Honour, MD  Estradiol 10 MCG TABS vaginal tablet Place 1 tablet (10 mcg total) vaginally 2 (two) times a week. 10/25/16  Yes Terrance Mass, MD  LINZESS 145 MCG CAPS capsule Take 145 mcg by mouth daily.  03/06/16  Yes [provider]  magnesium oxide (MAG-OX) 400 MG tablet Take 400 mg by mouth daily.   Yes [provider]  Multiple Vitamin (MULTIVITAMIN) tablet Take 1 tablet by mouth daily.   Yes [provider]  olmesartan-hydrochlorothiazide (BENICAR HCT) 40-12.5 MG tablet Take 1 tablet by mouth daily. 09/20/16  Yes Wardell Honour, MD  traZODone (DESYREL) 50 MG tablet TAKE 1 TO 2 TABLETS BY  MOUTH AT BEDTIME AS NEEDED  FOR SLEEP 10/10/16  Yes Tereasa Coop, PA-C  butalbital-acetaminophen-caffeine (FIORICET, ESGIC) 50-325-40 MG tablet Take 1-2 tablets by mouth every 6 (six) hours as needed for headache. Patient not taking: Reported on  01/02/2017 09/28/16 09/28/17  Wardell Honour, MD  estradiol (VIVELLE-DOT) 0.05 MG/24HR patch Place 1 patch (0.05 mg total) onto the skin 2 (two) times a week. Patient not taking: Reported on 01/02/2017 07/23/16   Wardell Honour, MD  ibuprofen (ADVIL,MOTRIN) 800 MG tablet Take 1 tablet (800 mg total) by mouth every 8 (eight) hours as needed. Patient not taking: Reported on 01/02/2017 09/26/16   Dalia Heading, PA-C  valACYclovir (VALTREX) 1000 MG tablet Take 1 tablet (1,000 mg total) by mouth 3 (three) times daily. Patient not taking: Reported on 01/02/2017 09/29/16   Alveda Reasons, MD    No Known Allergies  Patient Active Problem List   Diagnosis Date Noted  . Pelvic relaxation due to vaginal prolapse 10/18/2016  . Herpes simplex vulvovaginitis 10/03/2016  . Vaginal mucous membrane ulceration 10/03/2016  . Postmenopausal bleeding 10/03/2016  . Essential hypertension 04/27/2016  . Pure hypercholesterolemia 04/27/2016  . Glucose intolerance (impaired glucose tolerance) 04/27/2016  . Acute seasonal allergic rhinitis due to pollen 04/27/2016  . Fecal incontinence 07/15/2013  . Unspecified constipation 07/15/2013    Past Medical History:  Diagnosis Date  . Arthritis    DDD lumbar  . Chronic kidney disease   . Constipation   . Hyperlipidemia   . Hypertension   . Migraine   . STD (sexually transmitted disease)    HSV    Past Surgical History:  Procedure Laterality Date  . ABDOMINAL HYSTERECTOMY  uterine prolapse; ovaries intact.  Marland Kitchen BREAST BIOPSY     right breast  . CHOLECYSTECTOMY    . HEMORRHOID SURGERY    . HERNIA REPAIR    . LUMBAR DISC SURGERY    . SPINE SURGERY    . STEROID INJECTION TO SCAR    . TONSILLECTOMY      Social History   Social History  . Marital status: Married    Spouse name: N/A  . Number of children: 3  . Years of education: N/A   Occupational History  . employed [employer]   Social History  Main Topics  . Smoking status: Former Research scientist (life sciences)  . Smokeless tobacco: Never Used     Comment: not sure of year, she was in her 20's, she was not an everyday smoker   . Alcohol use No  . Drug use: No  . Sexual activity: Yes    Birth control/ protection: Post-menopausal, Surgical   Other Topics Concern  . Not on file   Social History Narrative   Marital status: married x 42 years; happily; no abuse      Children: 2 children (daughter 55, son 15); grandchildren (27)      Lives:  With husband.      Employment:  [employer] x 17 years;payment processing;  happy moderate.      Tobacco: none.      Alcohol: none; rare      Exercise:  Walking/treadmill/elliptical/weights gym at [place].  Goal is 5 days per week; usually 3 days per week.      Seatbelt:  100%; no texting      Guns: loaded and secured.                Family History  Problem Relation Age of Onset  . Diabetes Mother   . Heart disease Mother 11       stents x 5; AMI age 61.  Marland Kitchen Hyperlipidemia Mother   . Hypertension Mother   . Dementia Mother   . Heart disease Maternal Grandmother   . Heart disease Maternal Grandfather      Review of Systems  Constitutional: Negative for chills and fever.  HENT: Positive for sore throat. Negative for rhinorrhea.   Respiratory: Negative for hemoptysis and wheezing.   Cardiovascular: Negative for chest pain.  Musculoskeletal: Negative for myalgias.  Skin: Negative for rash.     Physical Exam  Constitutional: She is oriented to person, place, and time. She appears well-developed and well-nourished.  HENT:  Head: Normocephalic and atraumatic.  Right Ear: External ear normal.  Left Ear: External ear normal.  Nose: Nose normal.  Mouth/Throat: Uvula is midline. Posterior oropharyngeal erythema present. No oropharyngeal exudate, posterior oropharyngeal edema or tonsillar abscesses.  Eyes: Conjunctivae and EOM are normal. Pupils are equal, round, and reactive to light.  Neck: Normal  range of motion. Neck supple. No JVD present.  Cardiovascular: Normal rate, regular rhythm and normal heart sounds.   Pulmonary/Chest: Effort normal and breath sounds normal.  Abdominal: Soft. There is no tenderness.  Musculoskeletal: Normal range of motion.  Lymphadenopathy:    She has no cervical adenopathy.  Neurological: She is alert and oriented to person, place, and time. No sensory deficit. She exhibits normal muscle tone.  Skin: Skin is warm and dry.  Psychiatric: She has a normal mood and affect. Her behavior is normal.  Vitals reviewed.    ASSESSMENT & PLAN: Laurrie was seen today for  cough and sore throat.  Diagnoses and all orders for this visit:  Acute upper respiratory infection -     azithromycin (ZITHROMAX) 250 MG tablet; Sig as indicated  Cough -     promethazine-codeine (PHENERGAN WITH CODEINE) 6.25-10 MG/5ML syrup; Take 5 mLs by mouth at bedtime as needed for cough.  Acute laryngitis -     predniSONE (DELTASONE) 20 MG tablet; Take 2 tablets (40 mg total) by mouth daily with breakfast.    Patient Instructions       IF you received an x-ray today, you will receive an invoice from [facility]. Please contact [facility] at [phone] with questions or concerns regarding your invoice.   IF you received labwork today, you will receive an invoice from [facility]. Please contact [facility] at [phone] with questions or concerns regarding your invoice.   Our billing staff will not be able to assist you with questions regarding bills from these companies.  You will be contacted with the lab results as soon as they are available. The fastest way to get your results is to activate your My Chart account. Instructions are located on the last page of this paperwork. If you have not heard from [place] regarding the results in 2 weeks, please contact this office.     Laryngitis Laryngitis is swelling (inflammation) of your vocal cords. This causes  hoarseness, coughing, loss of voice, sore throat, or a dry throat. When your vocal cords are inflamed, your voice sounds different. Laryngitis can be temporary (acute) or long-term (chronic). Most cases of acute laryngitis improve with time. Chronic laryngitis is laryngitis that lasts for more than three weeks. Follow these instructions at home:  Drink enough fluid to keep your pee (urine) clear or pale yellow.  Breathe in moist air. Use a humidifier if you live in a dry climate.  Take medicines only as told by your doctor.  Do not smoke cigarettes or electronic cigarettes. If you need help quitting, ask your doctor.  Talk as little as possible. Also avoid whispering, which can cause vocal strain.  Write instead of talking. Do this until your voice is back to normal. Contact a doctor if:  You have a fever.  Your pain is worse.  You have trouble swallowing. Get help right away if:  You cough up blood.  You have trouble breathing. This information is not intended to replace advice given to you by your health care provider. Make sure you discuss any questions you have with your health care provider. Document Released: 06/14/2011 Document Revised: 12/01/2015 Document Reviewed: 12/08/2013 Elsevier Interactive Patient Education  2018 [place].  Upper Respiratory Infection, Adult Most upper respiratory infections (URIs) are caused by a virus. A URI affects the nose, throat, and upper air passages. The most common type of URI is often called "the common cold." Follow these instructions at home:  Take medicines only as told by your doctor.  Gargle warm saltwater or take cough drops to comfort your throat as told by your doctor.  Use a warm mist humidifier or inhale steam from a shower to increase air moisture. This may make it easier to breathe.  Drink enough fluid to keep your pee (urine) clear or pale yellow.  Eat soups and other clear broths.  Have a healthy diet.  Rest  as needed.  Go back to work when your fever is gone or your doctor says it is okay. ? You may need to stay home longer to avoid giving your URI to  others. ? You can also wear a face mask and wash your hands often to prevent spread of the virus.  Use your inhaler more if you have asthma.  Do not use any tobacco products, including cigarettes, chewing tobacco, or electronic cigarettes. If you need help quitting, ask your doctor. Contact a doctor if:  You are getting worse, not better.  Your symptoms are not helped by medicine.  You have chills.  You are getting more short of breath.  You have brown or red mucus.  You have yellow or brown discharge from your nose.  You have pain in your face, especially when you bend forward.  You have a fever.  You have puffy (swollen) neck glands.  You have pain while swallowing.  You have white areas in the back of your throat. Get help right away if:  You have very bad or constant: ? Headache. ? Ear pain. ? Pain in your forehead, behind your eyes, and over your cheekbones (sinus pain). ? Chest pain.  You have long-lasting (chronic) lung disease and any of the following: ? Wheezing. ? Long-lasting cough. ? Coughing up blood. ? A change in your usual mucus.  You have a stiff neck.  You have changes in your: ? Vision. ? Hearing. ? Thinking. ? Mood. This information is not intended to replace advice given to you by your health care provider. Make sure you discuss any questions you have with your health care provider. Document Released: 12/12/2007 Document Revised: 02/26/2016 Document Reviewed: 09/30/2013 Elsevier Interactive Patient Education  2018 Elsevier Inc.      Agustina Caroli, MD [facility]

## 2017-01-02 NOTE — Patient Instructions (Addendum)
   IF you received an x-ray today, you will receive an invoice from [facility]. Please contact [facility] at [phone] with questions or concerns regarding your invoice.   IF you received labwork today, you will receive an invoice from [facility]. Please contact [facility] at [phone] with questions or concerns regarding your invoice.   Our billing staff will not be able to assist you with questions regarding bills from these companies.  You will be contacted with the lab results as soon as they are available. The fastest way to get your results is to activate your My Chart account. Instructions are located on the last page of this paperwork. If you have not heard from us regarding the results in 2 weeks, please contact this office.      Laryngitis Laryngitis is swelling (inflammation) of your vocal cords. This causes hoarseness, coughing, loss of voice, sore throat, or a dry throat. When your vocal cords are inflamed, your voice sounds different. Laryngitis can be temporary (acute) or long-term (chronic). Most cases of acute laryngitis improve with time. Chronic laryngitis is laryngitis that lasts for more than three weeks. Follow these instructions at home:  Drink enough fluid to keep your pee (urine) clear or pale yellow.  Breathe in moist air. Use a humidifier if you live in a dry climate.  Take medicines only as told by your doctor.  Do not smoke cigarettes or electronic cigarettes. If you need help quitting, ask your doctor.  Talk as little as possible. Also avoid whispering, which can cause vocal strain.  Write instead of talking. Do this until your voice is back to normal. Contact a doctor if:  You have a fever.  Your pain is worse.  You have trouble swallowing. Get help right away if:  You cough up blood.  You have trouble breathing. This information is not intended to replace advice given to you by your health care provider. Make sure you  discuss any questions you have with your health care provider. Document Released: 06/14/2011 Document Revised: 12/01/2015 Document Reviewed: 12/08/2013 Elsevier Interactive Patient Education  2018 Elsevier Inc.  Upper Respiratory Infection, Adult Most upper respiratory infections (URIs) are caused by a virus. A URI affects the nose, throat, and upper air passages. The most common type of URI is often called "the common cold." Follow these instructions at home:  Take medicines only as told by your doctor.  Gargle warm saltwater or take cough drops to comfort your throat as told by your doctor.  Use a warm mist humidifier or inhale steam from a shower to increase air moisture. This may make it easier to breathe.  Drink enough fluid to keep your pee (urine) clear or pale yellow.  Eat soups and other clear broths.  Have a healthy diet.  Rest as needed.  Go back to work when your fever is gone or your doctor says it is okay. ? You may need to stay home longer to avoid giving your URI to others. ? You can also wear a face mask and wash your hands often to prevent spread of the virus.  Use your inhaler more if you have asthma.  Do not use any tobacco products, including cigarettes, chewing tobacco, or electronic cigarettes. If you need help quitting, ask your doctor. Contact a doctor if:  You are getting worse, not better.  Your symptoms are not helped by medicine.  You have chills.  You are getting more short of breath.  You have brown or   red mucus.  You have yellow or brown discharge from your nose.  You have pain in your face, especially when you bend forward.  You have a fever.  You have puffy (swollen) neck glands.  You have pain while swallowing.  You have white areas in the back of your throat. Get help right away if:  You have very bad or constant: ? Headache. ? Ear pain. ? Pain in your forehead, behind your eyes, and over your cheekbones (sinus  pain). ? Chest pain.  You have long-lasting (chronic) lung disease and any of the following: ? Wheezing. ? Long-lasting cough. ? Coughing up blood. ? A change in your usual mucus.  You have a stiff neck.  You have changes in your: ? Vision. ? Hearing. ? Thinking. ? Mood. This information is not intended to replace advice given to you by your health care provider. Make sure you discuss any questions you have with your health care provider. Document Released: 12/12/2007 Document Revised: 02/26/2016 Document Reviewed: 09/30/2013 Elsevier Interactive Patient Education  2018 Elsevier Inc.  

## 2017-03-27 ENCOUNTER — Encounter: Payer: Self-pay | Admitting: Family Medicine

## 2017-03-27 ENCOUNTER — Ambulatory Visit (INDEPENDENT_AMBULATORY_CARE_PROVIDER_SITE_OTHER): Payer: 59 | Admitting: Family Medicine

## 2017-03-27 VITALS — BP 117/84 | HR 80 | Temp 98.0°F | Resp 16 | Ht 67.32 in | Wt 192.0 lb

## 2017-03-27 DIAGNOSIS — A6004 Herpesviral vulvovaginitis: Secondary | ICD-10-CM

## 2017-03-27 DIAGNOSIS — N950 Postmenopausal bleeding: Secondary | ICD-10-CM | POA: Diagnosis not present

## 2017-03-27 DIAGNOSIS — R7989 Other specified abnormal findings of blood chemistry: Secondary | ICD-10-CM

## 2017-03-27 DIAGNOSIS — E041 Nontoxic single thyroid nodule: Secondary | ICD-10-CM

## 2017-03-27 DIAGNOSIS — Z6829 Body mass index (BMI) 29.0-29.9, adult: Secondary | ICD-10-CM

## 2017-03-27 DIAGNOSIS — E7800 Pure hypercholesterolemia, unspecified: Secondary | ICD-10-CM | POA: Diagnosis not present

## 2017-03-27 DIAGNOSIS — M542 Cervicalgia: Secondary | ICD-10-CM

## 2017-03-27 DIAGNOSIS — J301 Allergic rhinitis due to pollen: Secondary | ICD-10-CM | POA: Diagnosis not present

## 2017-03-27 DIAGNOSIS — I10 Essential (primary) hypertension: Secondary | ICD-10-CM | POA: Diagnosis not present

## 2017-03-27 DIAGNOSIS — N8110 Cystocele, unspecified: Secondary | ICD-10-CM | POA: Diagnosis not present

## 2017-03-27 DIAGNOSIS — N765 Ulceration of vagina: Secondary | ICD-10-CM | POA: Diagnosis not present

## 2017-03-27 DIAGNOSIS — R945 Abnormal results of liver function studies: Secondary | ICD-10-CM

## 2017-03-27 DIAGNOSIS — R059 Cough, unspecified: Secondary | ICD-10-CM

## 2017-03-27 DIAGNOSIS — R05 Cough: Secondary | ICD-10-CM | POA: Diagnosis not present

## 2017-03-27 MED ORDER — LIRAGLUTIDE -WEIGHT MANAGEMENT 18 MG/3ML ~~LOC~~ SOPN: 0.6000 mg | PEN_INJECTOR | Freq: Every day | SUBCUTANEOUS | 5 refills | Status: DC

## 2017-03-27 NOTE — Progress Notes (Signed)
Subjective:    Patient ID: Erin Peters, female    DOB: [birth year], 61 y.o.   MRN: [id number]  03/27/2017  Hypertension (follow-up) and Hyperlipidemia   HPI This 61 y.o. female presents for six month follow-up hypertension and hypercholesterolemia.  ALT elevated in 09/2016; triglycerides extremely elevated.   Since visit, presented due to post-menopausal bleeding; evaluated by Dr. Mingo Amber.  Referred to gynecology; evaluated by Dr. Toney Rakes in 09/2016.  Dx with vaginal mucosal ulceration secondary to pessary; +BV and candidiasis.  Prescribed Cleocin vaginal cream qhs for one week with Diflucan.   Healing on follow-up. Stopped patch and replaced with vaginal tablet.  Took two weeks to resolve; not much but small amount of spotting.   Every year having some type of issue; growing old sucks.  Scheduled September 07, 2016.  Ganem.   Will get flu vaccine at work in one month.  Coughing every day since three months.  S/p CXR in 09/2016.  Rx for Zithromax, Prednisone.  Neck pain on RIGHT.  History of thyroid nodules.  Last thyroid US in 2009.  Obesity: trying to restrict to 1100-1300 kcal per day.  Has dropped chocolate.  Saxenda.  B:  Oatmeal bar; some protein, coffee with sweetener and cream. Snack: banana; flavored water. Lunch: chucked roast, lettuce, guac Snack: nuts Supper: yogurt.  Little exercise.  Walking 2-2.5 miles per day.   BP Readings from Last 3 Encounters:  03/27/17 117/84  01/02/17 110/73  10/18/16 112/80   Wt Readings from Last 3 Encounters:  03/27/17 192 lb (87.1 kg)  01/02/17 193 lb 6.4 oz (87.7 kg)  10/18/16 191 lb 9.6 oz (86.9 kg)   Immunization History  Administered Date(s) Administered  . DTaP 07/09/2004  . Influenza Split 04/09/2015  . Influenza Whole 04/08/2013  . Influenza-Unspecified 04/08/2014, 04/08/2016  . Tdap 09/20/2016  . Zoster Recombinat (Shingrix) 12/21/2016    Review of Systems  Constitutional: Negative for chills, diaphoresis,  fatigue and fever.  HENT: Negative for congestion, postnasal drip, rhinorrhea, sinus pain, sinus pressure, sneezing, sore throat, trouble swallowing and voice change.   Eyes: Negative for visual disturbance.  Respiratory: Positive for cough. Negative for shortness of breath.   Cardiovascular: Negative for chest pain, palpitations and leg swelling.  Gastrointestinal: Negative for abdominal pain, constipation, diarrhea, nausea and vomiting.  Endocrine: Negative for cold intolerance, heat intolerance, polydipsia, polyphagia and polyuria.  Musculoskeletal: Positive for neck pain.  Neurological: Negative for dizziness, tremors, seizures, syncope, facial asymmetry, speech difficulty, weakness, light-headedness, numbness and headaches.    Past Medical History:  Diagnosis Date  . Arthritis    DDD lumbar  . Chronic kidney disease   . Constipation   . Hyperlipidemia   . Hypertension   . Migraine   . STD (sexually transmitted disease)    HSV   Past Surgical History:  Procedure Laterality Date  . ABDOMINAL HYSTERECTOMY     uterine prolapse; ovaries intact.  Marland Kitchen BREAST BIOPSY     right breast  . CHOLECYSTECTOMY    . HEMORRHOID SURGERY    . HERNIA REPAIR    . LUMBAR DISC SURGERY    . SPINE SURGERY    . STEROID INJECTION TO SCAR    . TONSILLECTOMY     No Known Allergies  Social History   Social History  . Marital status: Married    Spouse name: N/A  . Number of children: 3  . Years of education: N/A   Occupational History  . 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 Smoking status: Former Research scientist (life sciences)  . Smokeless tobacco: Never Used     Comment: not sure of year, she was in her 20's, she was not an everyday smoker   . Alcohol use No  . Drug use: No  . Sexual activity: Yes    Birth control/ protection: Post-menopausal, Surgical   Other Topics Concern  . Not on file   Social History Narrative   Marital status: married x 42  years; happily; no abuse      Children: 2 children (daughter 56, son 45); grandchildren (22)      Lives:  With husband.      Employment:  [employer] x 17 years;payment processing;  happy moderate.      Tobacco: none.      Alcohol: none; rare      Exercise:  Walking/treadmill/elliptical/weights gym at [place].  Goal is 5 days per week; usually 3 days per week.      Seatbelt:  100%; no texting      Guns: loaded and secured.               Family History  Problem Relation Age of Onset  . Diabetes Mother   . Heart disease Mother 47       stents x 5; AMI age 48.  Marland Kitchen Hyperlipidemia Mother   . Hypertension Mother   . Dementia Mother   . Heart disease Maternal Grandmother   . Heart disease Maternal Grandfather        Objective:    BP 117/84   Pulse 80   Temp 98 F (36.7 C) (Oral)   Resp 16   Ht 5' 7.32" (1.71 m)   Wt 192 lb (87.1 kg)   SpO2 96%   BMI 29.78 kg/m  Physical Exam  Constitutional: She is oriented to person, place, and time. She appears well-developed and well-nourished. No distress.  HENT:  Head: Normocephalic and atraumatic.  Right Ear: External ear normal.  Left Ear: External ear normal.  Nose: Nose normal.  Mouth/Throat: Oropharynx is clear and moist.  Eyes: Pupils are equal, round, and reactive to light. Conjunctivae and EOM are normal.  Neck: Normal range of motion. Neck supple. Carotid bruit is not present. No thyromegaly present.  Cardiovascular: Normal rate, regular rhythm, normal heart sounds and intact distal pulses.  Exam reveals no gallop and no friction rub.   No murmur heard. Pulmonary/Chest: Effort normal and breath sounds normal. She has no wheezes. She has no rales.  Abdominal: Soft. Bowel sounds are normal. She exhibits no distension and no mass. There is no tenderness. There is no rebound and no guarding.  Lymphadenopathy:    She has cervical adenopathy.       Right cervical: Superficial cervical adenopathy present.  Neurological: She is  alert and oriented to person, place, and time. No cranial nerve deficit.  Skin: Skin is warm and dry. No rash noted. She is not diaphoretic. No erythema. No pallor.  Psychiatric: She has a normal mood and affect. Her behavior is normal.    No results found. Depression screen [facility] 2/9 03/27/2017 01/02/2017 09/29/2016 09/26/2016 09/20/2016  Decreased Interest 0 0 0 0 0  Down, Depressed, Hopeless 0 0 0 0 0  PHQ - 2 Score 0 0 0 0 0   Fall Risk  03/27/2017 01/02/2017 09/29/2016 09/26/2016 09/20/2016  Falls in the past year? No No No No No  Number falls in past yr: - - - - -  Injury with Fall? - - - - -  Comment - - - - -        Assessment & Plan:   1. Pure hypercholesterolemia   2. Essential hypertension, benign   3. Elevated liver function tests   4. Cough   5. Neck pain   6. Thyroid nodule   7. BMI 29.0-29.9,adult   8. Vaginal mucous membrane ulceration   9. Herpes simplex vulvovaginitis   10. Pelvic relaxation due to vaginal prolapse   11. Acute seasonal allergic rhinitis due to pollen   12. Postmenopausal bleeding    -controlled blood pressure and cholesterol; obtain labs for chronic disease management.  COntinue current medications. -new onset R anterior neck pain associated with possible LAD; history of thyroid nodules; obtain repeat thyroid US. -new onset elevated LFTs; likely due to NASH; repeat LFTs today; if remains elevated, obtain abdominal US. -worsening overweight status; pt requesting Saxenda.  Not exercising; encourage exercise.  -recommend weight loss, exercise for 30-60 minutes five days per week; recommend 1200 kcal restriction per day with a minimum of 60 grams of protein per day.  Follow-up in one month; consider referral to weight loss management with Dr. Leafy Ro. -s/p gynecology consultation for postmenopausal bleeding secondary to BV and candidiasis with vaginal ulceration; transitioned off of estrogen patch and to vaginal estrogen therapy only.     Orders Placed  This Encounter  Procedures  . US THYROID    Standing Status:   Future    Standing Expiration Date:   05/27/2018    Order Specific Question:   Reason for Exam (SYMPTOM  OR DIAGNOSIS REQUIRED)    Answer:   thyroid nodule history with RIGHT neck pain    Order Specific Question:   Preferred imaging location?    Answer:   [facility]  . CBC with Differential/Platelet  . Comprehensive metabolic panel    Order Specific Question:   Has the patient fasted?    Answer:   Yes  . Lipid panel    Order Specific Question:   Has the patient fasted?    Answer:   Yes  . Acute Hep Panel & Hep B Surface Ab   Meds ordered this encounter  Medications  . DISCONTD: Liraglutide -Weight Management (SAXENDA) 18 MG/3ML SOPN    Sig: Inject 0.6 mg into the skin daily.    Dispense:  3 mL    Refill:  5  . Liraglutide -Weight Management (SAXENDA) 18 MG/3ML SOPN    Sig: Inject 0.6 mg into the skin daily.    Dispense:  3 mL    Refill:  5    Return in about 4 weeks (around 04/24/2017) for recheck WEIGHT LOSS, MEDICATION CHECK.   Kyria Bumgardner Elayne Guerin, M.D. Primary Care at [facility] [place] [address] [place], [place]  [address] [phone] phone [phone] fax

## 2017-03-27 NOTE — Patient Instructions (Addendum)
GOAL: 60 GRAMS OF PROTEIN PER DAY.  1200-1400 CALORIES PER DAY.  EXERCISE FOR 45-60  MINUTES ON SATURDAYS AND SUNDAYS.   IF you received an x-ray today, you will receive an invoice from [facility]. Please contact [facility] at [phone] with questions or concerns regarding your invoice.   IF you received labwork today, you will receive an invoice from [facility]. Please contact [facility] at [phone] with questions or concerns regarding your invoice.   Our billing staff will not be able to assist you with questions regarding bills from these companies.  You will be contacted with the lab results as soon as they are available. The fastest way to get your results is to activate your My Chart account. Instructions are located on the last page of this paperwork. If you have not heard from [place] regarding the results in 2 weeks, please contact this office.      Calorie Counting for Weight Loss Calories are units of energy. Your body needs a certain amount of calories from food to keep you going throughout the day. When you eat more calories than your body needs, your body stores the extra calories as fat. When you eat fewer calories than your body needs, your body burns fat to get the energy it needs. Calorie counting means keeping track of how many calories you eat and drink each day. Calorie counting can be helpful if you need to lose weight. If you make sure to eat fewer calories than your body needs, you should lose weight. Ask your health care provider what a healthy weight is for you. For calorie counting to work, you will need to eat the right number of calories in a day in order to lose a healthy amount of weight per week. A dietitian can help you determine how many calories you need in a day and will give you suggestions on how to reach your calorie goal.  A healthy amount of weight to lose per week is usually 1-2 lb (0.5-0.9 kg). This usually means that your daily  calorie intake should be reduced by 500-750 calories.  Eating 1,200 - 1,500 calories per day can help most women lose weight.  Eating 1,500 - 1,800 calories per day can help most men lose weight.  What is my plan? My goal is to have __________ calories per day. If I have this many calories per day, I should lose around __________ pounds per week. What do I need to know about calorie counting? In order to meet your daily calorie goal, you will need to:  Find out how many calories are in each food you would like to eat. Try to do this before you eat.  Decide how much of the food you plan to eat.  Write down what you ate and how many calories it had. Doing this is called keeping a food log.  To successfully lose weight, it is important to balance calorie counting with a healthy lifestyle that includes regular activity. Aim for 150 minutes of moderate exercise (such as walking) or 75 minutes of vigorous exercise (such as running) each week. Where do I find calorie information?  The number of calories in a food can be found on a Nutrition Facts label. If a food does not have a Nutrition Facts label, try to look up the calories online or ask your dietitian for help. Remember that calories are listed per serving. If you choose to have more than one serving of a food, you will  have to multiply the calories per serving by the amount of servings you plan to eat. For example, the label on a package of bread might say that a serving size is 1 slice and that there are 90 calories in a serving. If you eat 1 slice, you will have eaten 90 calories. If you eat 2 slices, you will have eaten 180 calories. How do I keep a food log? Immediately after each meal, record the following information in your food log:  What you ate. Don't forget to include toppings, sauces, and other extras on the food.  How much you ate. This can be measured in cups, ounces, or number of items.  How many calories each food and  drink had.  The total number of calories in the meal.  Keep your food log near you, such as in a small notebook in your pocket, or use a mobile app or website. Some programs will calculate calories for you and show you how many calories you have left for the day to meet your goal. What are some calorie counting tips?  Use your calories on foods and drinks that will fill you up and not leave you hungry: ? Some examples of foods that fill you up are nuts and nut butters, vegetables, lean proteins, and high-fiber foods like whole grains. High-fiber foods are foods with more than 5 g fiber per serving. ? Drinks such as sodas, specialty coffee drinks, alcohol, and juices have a lot of calories, yet do not fill you up.  Eat nutritious foods and avoid empty calories. Empty calories are calories you get from foods or beverages that do not have many vitamins or protein, such as candy, sweets, and soda. It is better to have a nutritious high-calorie food (such as an avocado) than a food with few nutrients (such as a bag of chips).  Know how many calories are in the foods you eat most often. This will help you calculate calorie counts faster.  Pay attention to calories in drinks. Low-calorie drinks include water and unsweetened drinks.  Pay attention to nutrition labels for "low fat" or "fat free" foods. These foods sometimes have the same amount of calories or more calories than the full fat versions. They also often have added sugar, starch, or salt, to make up for flavor that was removed with the fat.  Find a way of tracking calories that works for you. Get creative. Try different apps or programs if writing down calories does not work for you. What are some portion control tips?  Know how many calories are in a serving. This will help you know how many servings of a certain food you can have.  Use a measuring cup to measure serving sizes. You could also try weighing out portions on a kitchen scale.  With time, you will be able to estimate serving sizes for some foods.  Take some time to put servings of different foods on your favorite plates, bowls, and cups so you know what a serving looks like.  Try not to eat straight from a bag or box. Doing this can lead to overeating. Put the amount you would like to eat in a cup or on a plate to make sure you are eating the right portion.  Use smaller plates, glasses, and bowls to prevent overeating.  Try not to multitask (for example, watch TV or use your computer) while eating. If it is time to eat, sit down at a table and enjoy your  food. This will help you to know when you are full. It will also help you to be aware of what you are eating and how much you are eating. What are tips for following this plan? Reading food labels  Check the calorie count compared to the serving size. The serving size may be smaller than what you are used to eating.  Check the source of the calories. Make sure the food you are eating is high in vitamins and protein and low in saturated and trans fats. Shopping  Read nutrition labels while you shop. This will help you make healthy decisions before you decide to purchase your food.  Make a grocery list and stick to it. Cooking  Try to cook your favorite foods in a healthier way. For example, try baking instead of frying.  Use low-fat dairy products. Meal planning  Use more fruits and vegetables. Half of your plate should be fruits and vegetables.  Include lean proteins like poultry and fish. How do I count calories when eating out?  Ask for smaller portion sizes.  Consider sharing an entree and sides instead of getting your own entree.  If you get your own entree, eat only half. Ask for a box at the beginning of your meal and put the rest of your entree in it so you are not tempted to eat it.  If calories are listed on the menu, choose the lower calorie options.  Choose dishes that include vegetables,  fruits, whole grains, low-fat dairy products, and lean protein.  Choose items that are boiled, broiled, grilled, or steamed. Stay away from items that are buttered, battered, fried, or served with cream sauce. Items labeled "crispy" are usually fried, unless stated otherwise.  Choose water, low-fat milk, unsweetened iced tea, or other drinks without added sugar. If you want an alcoholic beverage, choose a lower calorie option such as a glass of wine or light beer.  Ask for dressings, sauces, and syrups on the side. These are usually high in calories, so you should limit the amount you eat.  If you want a salad, choose a garden salad and ask for grilled meats. Avoid extra toppings like bacon, cheese, or fried items. Ask for the dressing on the side, or ask for olive oil and vinegar or lemon to use as dressing.  Estimate how many servings of a food you are given. For example, a serving of cooked rice is  cup or about the size of half a baseball. Knowing serving sizes will help you be aware of how much food you are eating at restaurants. The list below tells you how big or small some common portion sizes are based on everyday objects: ? 1 oz-4 stacked dice. ? 3 oz-1 deck of cards. ? 1 tsp-1 die. ? 1 Tbsp- a ping-pong ball. ? 2 Tbsp-1 ping-pong ball. ?  cup- baseball. ? 1 cup-1 baseball. Summary  Calorie counting means keeping track of how many calories you eat and drink each day. If you eat fewer calories than your body needs, you should lose weight.  A healthy amount of weight to lose per week is usually 1-2 lb (0.5-0.9 kg). This usually means reducing your daily calorie intake by 500-750 calories.  The number of calories in a food can be found on a Nutrition Facts label. If a food does not have a Nutrition Facts label, try to look up the calories online or ask your dietitian for help.  Use your calories on foods and drinks  that will fill you up, and not on foods and drinks that will  leave you hungry.  Use smaller plates, glasses, and bowls to prevent overeating. This information is not intended to replace advice given to you by your health care provider. Make sure you discuss any questions you have with your health care provider. Document Released: 06/25/2005 Document Revised: 05/25/2016 Document Reviewed: 05/25/2016 Elsevier Interactive Patient Education  2017 [employer].

## 2017-03-28 ENCOUNTER — Telehealth: Payer: Self-pay | Admitting: Family Medicine

## 2017-03-28 LAB — LIPID PANEL
CHOLESTEROL TOTAL: 168 mg/dL (ref 100–199)
Chol/HDL Ratio: 3.3 ratio (ref 0.0–4.4)
HDL: 51 mg/dL (ref >39)
LDL CALC: 87 mg/dL (ref 0–99)
TRIGLYCERIDES: 152 mg/dL — AB (ref 0–149)
VLDL CHOLESTEROL CAL: 30 mg/dL (ref 5–40)

## 2017-03-28 LAB — COMPREHENSIVE METABOLIC PANEL
ALK PHOS: 94 IU/L (ref 39–117)
ALT: 20 IU/L (ref 0–32)
AST: 25 IU/L (ref 0–40)
Albumin/Globulin Ratio: 1.7 (ref 1.2–2.2)
Albumin: 5 g/dL — ABNORMAL HIGH (ref 3.6–4.8)
BUN/Creatinine Ratio: 20 (ref 12–28)
BUN: 13 mg/dL (ref 8–27)
Bilirubin Total: 0.5 mg/dL (ref 0.0–1.2)
CO2: 26 mmol/L (ref 20–29)
CREATININE: 0.66 mg/dL (ref 0.57–1.00)
Calcium: 10.5 mg/dL — ABNORMAL HIGH (ref 8.7–10.3)
Chloride: 102 mmol/L (ref 96–106)
GFR calc Af Amer: 111 mL/min/{1.73_m2} (ref >59)
GFR calc non Af Amer: 96 mL/min/{1.73_m2} (ref >59)
Globulin, Total: 3 g/dL (ref 1.5–4.5)
Glucose: 99 mg/dL (ref 65–99)
Potassium: 4.3 mmol/L (ref 3.5–5.2)
Sodium: 146 mmol/L — ABNORMAL HIGH (ref 134–144)
Total Protein: 8 g/dL (ref 6.0–8.5)

## 2017-03-28 LAB — ACUTE HEP PANEL AND HEP B SURFACE AB
Hep A IgM: NEGATIVE
Hep B C IgM: NEGATIVE
Hep C Virus Ab: <0.1 s/co ratio (ref 0.0–0.9)
Hepatitis B Surf Ab Quant: <3.1 m[IU]/mL — ABNORMAL LOW (ref >9.9)
Hepatitis B Surface Ag: NEGATIVE

## 2017-03-28 LAB — CBC WITH DIFFERENTIAL/PLATELET
Basophils Absolute: 0 10*3/uL (ref 0.0–0.2)
Basos: 0 %
EOS (ABSOLUTE): 0 10*3/uL (ref 0.0–0.4)
Eos: 0 %
HEMOGLOBIN: 14.5 g/dL (ref 11.1–15.9)
Hematocrit: 44.7 % (ref 34.0–46.6)
IMMATURE GRANULOCYTES: 0 %
Immature Grans (Abs): 0 10*3/uL (ref 0.0–0.1)
LYMPHS ABS: 2.2 10*3/uL (ref 0.7–3.1)
Lymphs: 35 %
MCH: 29.9 pg (ref 26.6–33.0)
MCHC: 32.4 g/dL (ref 31.5–35.7)
MCV: 92 fL (ref 79–97)
MONOCYTES: 8 %
Monocytes Absolute: 0.5 10*3/uL (ref 0.1–0.9)
NEUTROS PCT: 57 %
Neutrophils Absolute: 3.5 10*3/uL (ref 1.4–7.0)
Platelets: 333 10*3/uL (ref 150–379)
RBC: 4.85 x10E6/uL (ref 3.77–5.28)
RDW: 12.8 % (ref 12.3–15.4)
WBC: 6.2 10*3/uL (ref 3.4–10.8)

## 2017-03-28 NOTE — Telephone Encounter (Signed)
Pt's insurance will not cover the Liraglutide.  Would this need to be prior authorized or can something else be called in for this?  Pt states if it is an oral medication, [facility] can go to the [facility] on [address].  If it is an injection, please [facility] to [facility] on [address]   [phone]

## 2017-04-01 ENCOUNTER — Encounter: Payer: Self-pay | Admitting: Family Medicine

## 2017-04-02 MED ORDER — TOPIRAMATE 25 MG PO TABS: 25.0000 mg | ORAL_TABLET | Freq: Two times a day (BID) | ORAL | 5 refills | Status: DC

## 2017-04-02 NOTE — Telephone Encounter (Signed)
MyChart message sent --- I have sent in Topiramate or generic Topamax which causes weight loss and is indicated for migraine prevention.

## 2017-04-05 DIAGNOSIS — Z23 Encounter for immunization: Secondary | ICD-10-CM | POA: Diagnosis not present

## 2017-04-05 IMAGING — CR DG CHEST 2V
2 series · 2 of 2 positions shown · non-contrast
Comparison: Chest CT 08/20/2006.  Chest radiographs 07/31/2006.

CLINICAL DATA: 59-year-old female with cough, fever. Recurrent
maxillary sinusitis. Bronchospasm. Initial encounter.

EXAM:
CHEST  2 VIEW

[lateral]
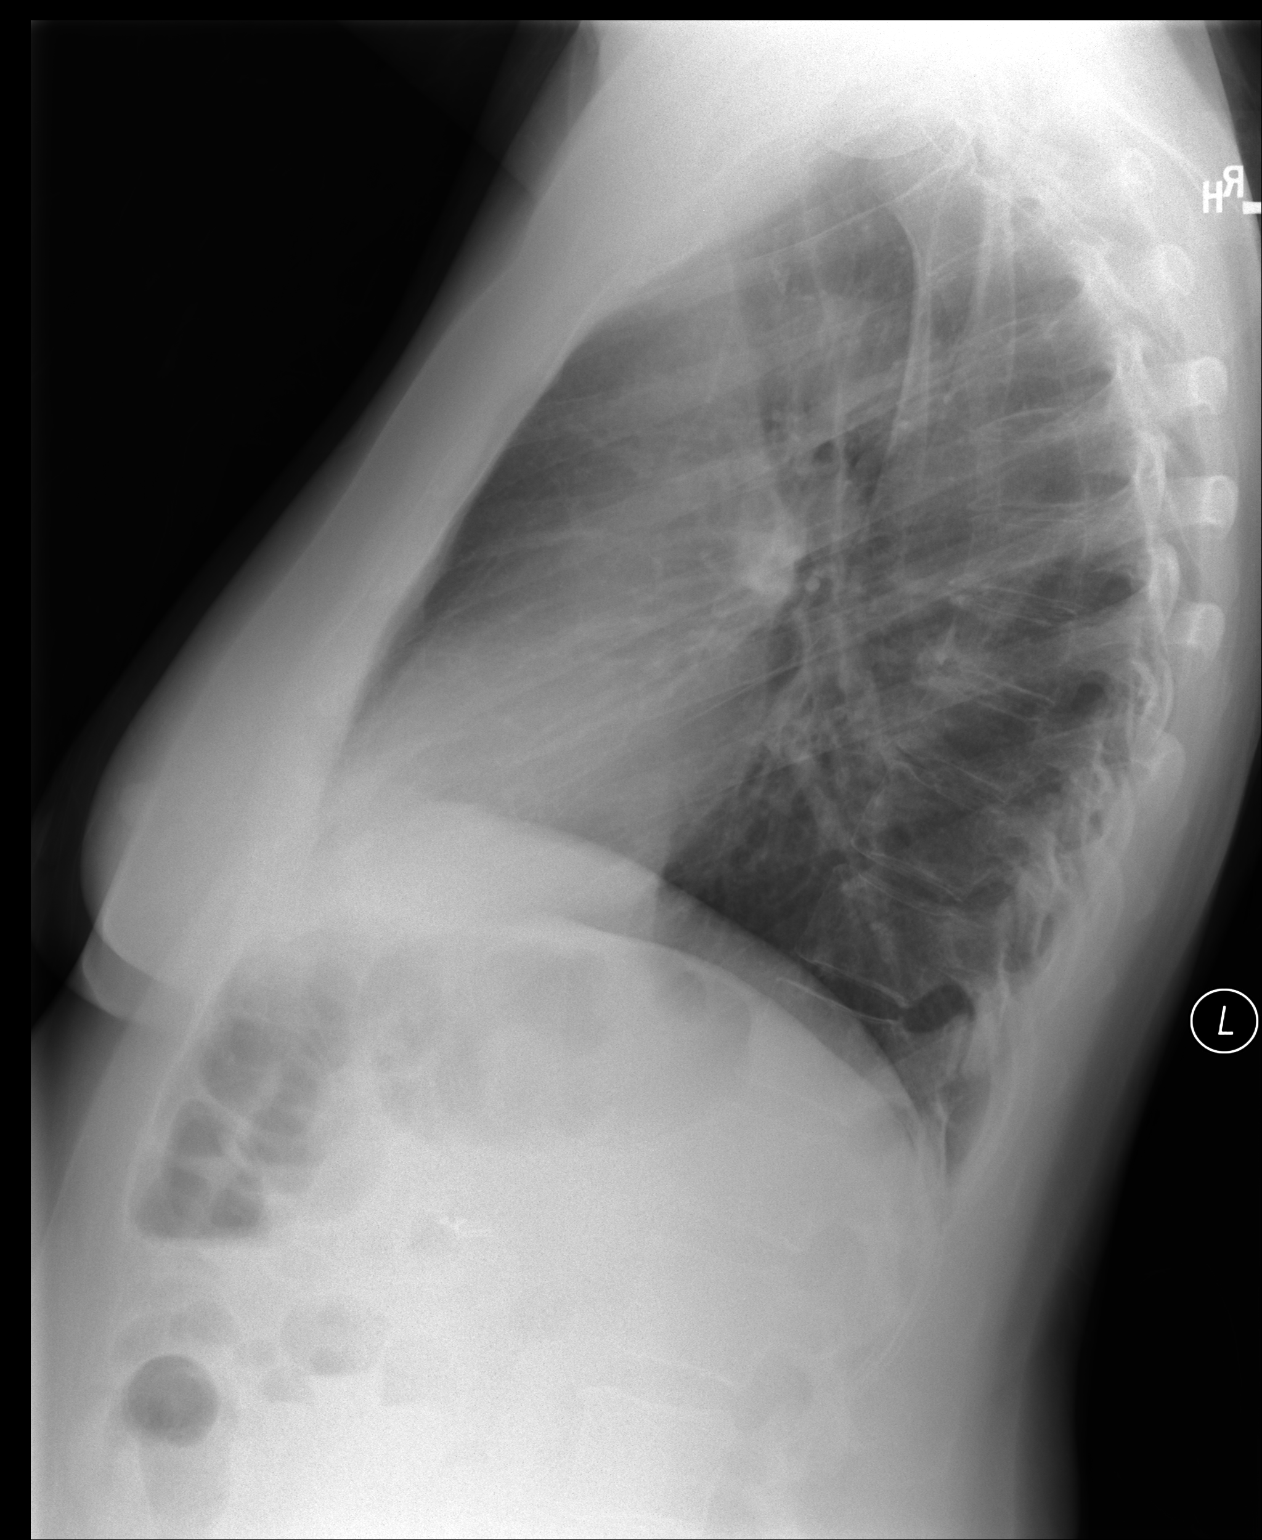

[PA]
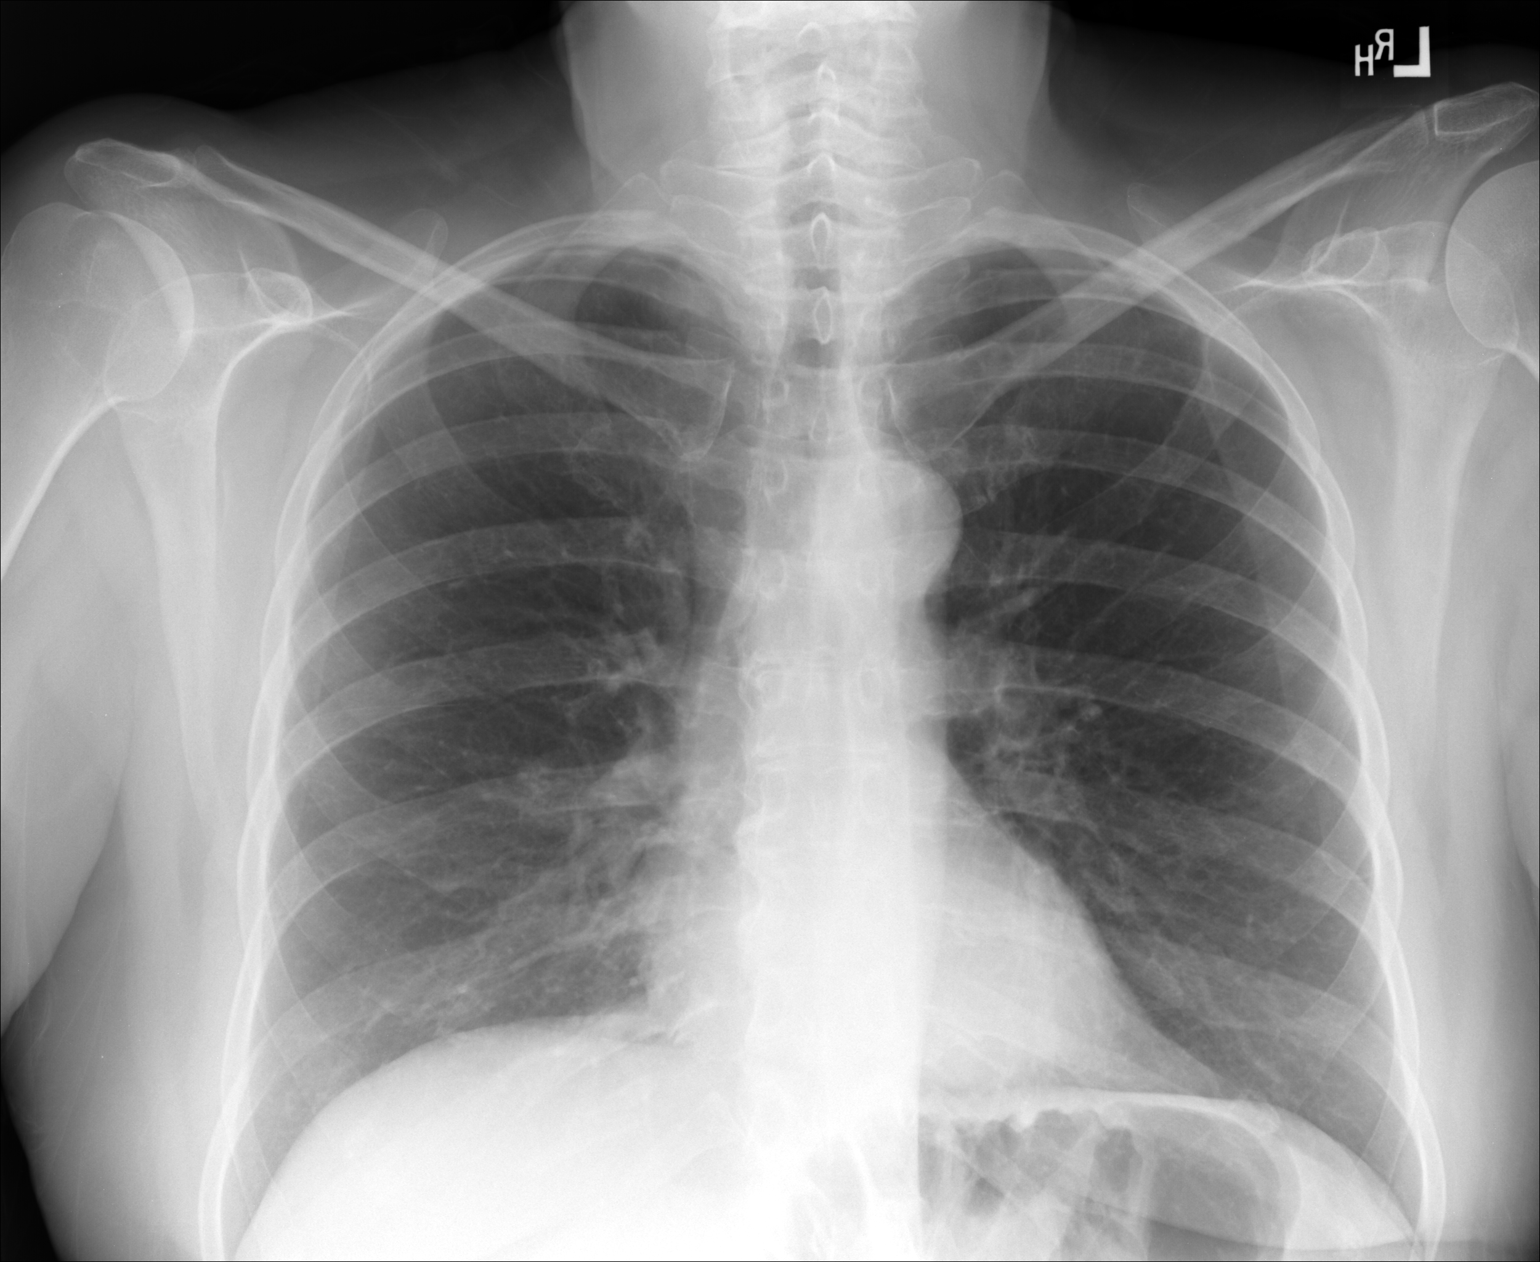

[2 of 2 positions shown; findings below may reference images not displayed]

FINDINGS: Lung volumes are stable since 0660. Hilar and mediastinal contours
are stable and within normal limits. Visualized tracheal air column
is within normal limits. No pneumothorax or pulmonary edema. No
pleural effusion or consolidation. Right middle lobe markings are
stable compared to 0660. No confluent pulmonary opacity. Interval
cholecystectomy. No acute osseous abnormality identified.
IMPRESSION: No acute cardiopulmonary abnormality. Right middle lobe lung
markings appear stable compared to 0660.

## 2017-04-10 DIAGNOSIS — D2372 Other benign neoplasm of skin of left lower limb, including hip: Secondary | ICD-10-CM | POA: Diagnosis not present

## 2017-04-10 DIAGNOSIS — L821 Other seborrheic keratosis: Secondary | ICD-10-CM | POA: Diagnosis not present

## 2017-04-10 DIAGNOSIS — L820 Inflamed seborrheic keratosis: Secondary | ICD-10-CM | POA: Diagnosis not present

## 2017-04-10 DIAGNOSIS — L918 Other hypertrophic disorders of the skin: Secondary | ICD-10-CM | POA: Diagnosis not present

## 2017-04-24 ENCOUNTER — Ambulatory Visit
Admission: RE | Admit: 2017-04-24 | Discharge: 2017-04-24 | Disposition: A | Discharge status: Home / Self Care | Payer: 59 | Source: Ambulatory Visit | Attending: Family Medicine | Admitting: Family Medicine

## 2017-04-24 ENCOUNTER — Ambulatory Visit: Payer: 59 | Admitting: Family Medicine

## 2017-04-24 DIAGNOSIS — M542 Cervicalgia: Secondary | ICD-10-CM

## 2017-04-24 DIAGNOSIS — E041 Nontoxic single thyroid nodule: Secondary | ICD-10-CM

## 2017-04-24 DIAGNOSIS — E042 Nontoxic multinodular goiter: Secondary | ICD-10-CM | POA: Diagnosis not present

## 2017-04-29 ENCOUNTER — Ambulatory Visit (INDEPENDENT_AMBULATORY_CARE_PROVIDER_SITE_OTHER): Payer: 59 | Admitting: Family Medicine

## 2017-04-29 ENCOUNTER — Encounter: Payer: Self-pay | Admitting: Family Medicine

## 2017-04-29 VITALS — BP 118/70 | HR 91 | Temp 98.0°F | Resp 16 | Ht 68.11 in | Wt 190.0 lb

## 2017-04-29 DIAGNOSIS — G43009 Migraine without aura, not intractable, without status migrainosus: Secondary | ICD-10-CM

## 2017-04-29 DIAGNOSIS — R945 Abnormal results of liver function studies: Secondary | ICD-10-CM

## 2017-04-29 DIAGNOSIS — E041 Nontoxic single thyroid nodule: Secondary | ICD-10-CM | POA: Diagnosis not present

## 2017-04-29 DIAGNOSIS — R7989 Other specified abnormal findings of blood chemistry: Secondary | ICD-10-CM

## 2017-04-29 DIAGNOSIS — Z6828 Body mass index (BMI) 28.0-28.9, adult: Secondary | ICD-10-CM | POA: Diagnosis not present

## 2017-04-29 MED ORDER — PHENTERMINE HCL 8 MG PO TABS: 8.0000 mg | ORAL_TABLET | Freq: Every day | ORAL | 0 refills | Status: DC

## 2017-04-29 NOTE — Progress Notes (Signed)
Subjective:    Patient ID: Erin Peters, female    DOB: February 04, 1956, 61 y.o.   MRN: [id number]  04/29/2017  Weight Check (follow-up with medication )    HPI This 61 y.o. female presents for evaluation of migraines, elevated lfts, OBESITY.  Management changes made at last visit:   -controlled blood pressure and cholesterol; obtain labs for chronic disease management.  COntinue current medications. -new onset R anterior neck pain associated with possible LAD; history of thyroid nodules; obtain repeat thyroid US. -new onset elevated LFTs; likely due to NASH; repeat LFTs today; if remains elevated, obtain abdominal US. -worsening overweight status; pt requesting Saxenda.  Not exercising; encourage exercise.  -recommend weight loss, exercise for 30-60 minutes five days per week; recommend 1200 kcal restriction per day with a minimum of 60 grams of protein per day.  Follow-up in one month; consider referral to weight loss management with Dr. Leafy Ro. -s/p gynecology consultation for postmenopausal bleeding secondary to BV and candidiasis with vaginal ulceration; transitioned off of estrogen patch and to vaginal estrogen therapy only.  Insurance would not pay for Victoza.   Had been getting headaches.  No headaches since starting Topamax.  Appetite is decreased with Topiramate.   No caffeine. B: cottage cheese or protein bar. L: chicken or beef, vegetable or salad Supper: salad, chicken, dips fork in ranch. Water and decaf tea hot or diet green tea. Exercising 3-4 days per week 30-45 minutes; elliptical and walking. Adding weights.  Elliptical at home; gym at work. No snacks. Not hungry.  Thyroid ultrasound was stable.    BP Readings from Last 3 Encounters:  04/29/17 118/70  03/27/17 117/84  01/02/17 110/73   Wt Readings from Last 3 Encounters:  04/29/17 190 lb (86.2 kg)  03/27/17 192 lb (87.1 kg)  01/02/17 193 lb 6.4 oz (87.7 kg)   Immunization History  Administered  Date(s) Administered  . DTaP 07/09/2004  . Influenza Split 04/09/2015  . Influenza Whole 04/08/2013  . Influenza-Unspecified 04/08/2014, 04/08/2016  . Tdap 09/20/2016  . Zoster Recombinat (Shingrix) 12/21/2016    Review of Systems  Constitutional: Negative for chills, diaphoresis, fatigue and fever.  Eyes: Negative for visual disturbance.  Respiratory: Negative for cough and shortness of breath.   Cardiovascular: Negative for chest pain, palpitations and leg swelling.  Gastrointestinal: Negative for abdominal pain, constipation, diarrhea, nausea and vomiting.  Endocrine: Negative for cold intolerance, heat intolerance, polydipsia, polyphagia and polyuria.  Neurological: Negative for dizziness, tremors, seizures, syncope, facial asymmetry, speech difficulty, weakness, light-headedness, numbness and headaches.    Past Medical History:  Diagnosis Date  . Arthritis    DDD lumbar  . Chronic kidney disease   . Constipation   . Hyperlipidemia   . Hypertension   . Migraine   . STD (sexually transmitted disease)    HSV   Past Surgical History:  Procedure Laterality Date  . ABDOMINAL HYSTERECTOMY     uterine prolapse; ovaries intact.  Marland Kitchen BREAST BIOPSY     right breast  . CHOLECYSTECTOMY    . HEMORRHOID SURGERY    . HERNIA REPAIR    . LUMBAR DISC SURGERY    . SPINE SURGERY    . STEROID INJECTION TO SCAR    . TONSILLECTOMY     No Known Allergies Current Outpatient Prescriptions on File Prior to Visit  Medication Sig Dispense Refill  . ALPRAZolam (XANAX) 0.5 MG tablet Take 1 tablet (0.5 mg total) by mouth daily as needed for anxiety. 10 tablet 0  .  atorvastatin (LIPITOR) 20 MG tablet Take 1 tablet (20 mg total) by mouth daily. 90 tablet 3  . Estradiol 10 MCG TABS vaginal tablet Place 1 tablet (10 mcg total) vaginally 2 (two) times a week. 8 tablet 11  . ibuprofen (ADVIL,MOTRIN) 800 MG tablet Take 1 tablet (800 mg total) by mouth every 8 (eight) hours as needed. 21 tablet 0  .  LINZESS 145 MCG CAPS capsule Take 145 mcg by mouth daily.     . magnesium oxide (MAG-OX) 400 MG tablet Take 400 mg by mouth daily.    . Multiple Vitamin (MULTIVITAMIN) tablet Take 1 tablet by mouth daily.    Marland Kitchen olmesartan-hydrochlorothiazide (BENICAR HCT) 40-12.5 MG tablet Take 1 tablet by mouth daily. 90 tablet 3  . topiramate (TOPAMAX) 25 MG tablet Take 1 tablet (25 mg total) by mouth 2 (two) times daily. 60 tablet 5  . traZODone (DESYREL) 50 MG tablet TAKE 1 TO 2 TABLETS BY  MOUTH AT BEDTIME AS NEEDED  FOR SLEEP 180 tablet 0   No current facility-administered medications on file prior to visit.    Social History   Social History  . Marital status: Married    Spouse name: N/A  . Number of children: 3  . Years of education: N/A   Occupational History  . employed [employer]   Social History Main Topics  . Smoking status: Former Research scientist (life sciences)  . Smokeless tobacco: Never Used     Comment: not sure of year, she was in her 20's, she was not an everyday smoker   . Alcohol use No  . Drug use: No  . Sexual activity: Yes    Birth control/ protection: Post-menopausal, Surgical   Other Topics Concern  . Not on file   Social History Narrative   Marital status: married x 42 years; happily; no abuse      Children: 2 children (daughter 32, son 28); grandchildren (52)      Lives:  With husband.      Employment:  [place] x 17 years;payment processing;  happy moderate.      Tobacco: none.      Alcohol: none; rare      Exercise:  Walking/treadmill/elliptical/weights gym at [place].  Goal is 5 days per week; usually 3 days per week.      Seatbelt:  100%; no texting      Guns: loaded and secured.               Family History  Problem Relation Age of Onset  . Diabetes Mother   . Heart disease Mother 38       stents x 5; AMI age 2.  Marland Kitchen Hyperlipidemia Mother   . Hypertension Mother   . Dementia Mother   . Heart disease Maternal Grandmother   .  Heart disease Maternal Grandfather        Objective:    BP 118/70   Pulse 91   Temp 98 F (36.7 C) (Oral)   Resp 16   Ht 5' 8.11" (1.73 m)   Wt 190 lb (86.2 kg)   SpO2 98%   BMI 28.80 kg/m  Physical Exam  Constitutional: She is oriented to person, place, and time. She appears well-developed and well-nourished. No distress.  HENT:  Head: Normocephalic and atraumatic.  Right Ear: External ear normal.  Left Ear: External ear normal.  Nose: Nose normal.  Mouth/Throat: Oropharynx is clear and moist.  Eyes: Pupils are  equal, round, and reactive to light. Conjunctivae and EOM are normal.  Neck: Normal range of motion. Neck supple. Carotid bruit is not present. No thyromegaly present.  Cardiovascular: Normal rate, regular rhythm, normal heart sounds and intact distal pulses.  Exam reveals no gallop and no friction rub.   No murmur heard. Pulmonary/Chest: Effort normal and breath sounds normal. She has no wheezes. She has no rales.  Abdominal: Soft. Bowel sounds are normal. She exhibits no distension and no mass. There is no tenderness. There is no rebound and no guarding.  Lymphadenopathy:    She has no cervical adenopathy.  Neurological: She is alert and oriented to person, place, and time. No cranial nerve deficit.  Skin: Skin is warm and dry. No rash noted. She is not diaphoretic. No erythema. No pallor.  Psychiatric: She has a normal mood and affect. Her behavior is normal.   No results found. Depression screen [facility] 2/9 04/29/2017 03/27/2017 01/02/2017 09/29/2016 09/26/2016  Decreased Interest 0 0 0 0 0  Down, Depressed, Hopeless 0 0 0 0 0  PHQ - 2 Score 0 0 0 0 0   Fall Risk  04/29/2017 03/27/2017 01/02/2017 09/29/2016 09/26/2016  Falls in the past year? No No No No No  Number falls in past yr: - - - - -  Injury with [place]:   1. Migraine without aura and without status migrainosus, not intractable   2. Hypercalcemia   3.  Thyroid nodule   4. BMI 28.0-28.9,adult   5. Elevated LFTs     -migraines much improved with Topamax; continue current dose. -new onset hypercalcemia; repeat today with intact PTH. -s/p thyroid ultrasound with stable thyroid nodules; no further work up needed. -new onset LFTs; repeat today. -congratulations on weight loss; add Phentermine 8mg  daily.     Orders Placed This Encounter  Procedures  . Comprehensive metabolic panel  . Parathyroid hormone, intact (no Ca)   Meds ordered this encounter  Medications  . Phentermine HCl 8 MG TABS    Sig: Take 8 mg by mouth daily.    Dispense:  30 each    Refill:  0    Return in about 4 weeks (around 05/27/2017) for recheck weight, migraines.   Kristi Elayne Guerin, M.D. Primary Care at [facility] previously [facility] [place] [address] [place], [place]  [address] [phone] phone [phone] fax

## 2017-04-29 NOTE — Patient Instructions (Addendum)
     IF you received an x-ray today, you will receive an invoice from [facility]. Please contact  Radiology at [phone] with questions or concerns regarding your invoice.   IF you received labwork today, you will receive an invoice from [facility]. Please contact [facility] at [phone] with questions or concerns regarding your invoice.   Our billing staff will not be able to assist you with questions regarding bills from these companies.  You will be contacted with the lab results as soon as they are available. The fastest way to get your results is to activate your My Chart account. Instructions are located on the last page of this paperwork. If you have not heard from us regarding the results in 2 weeks, please contact this office.     

## 2017-04-30 LAB — COMPREHENSIVE METABOLIC PANEL
ALBUMIN: 4.9 g/dL — AB (ref 3.6–4.8)
ALT: 16 IU/L (ref 0–32)
AST: 22 IU/L (ref 0–40)
Albumin/Globulin Ratio: 1.8 (ref 1.2–2.2)
Alkaline Phosphatase: 98 IU/L (ref 39–117)
BUN / CREAT RATIO: 18 (ref 12–28)
BUN: 15 mg/dL (ref 8–27)
Bilirubin Total: 0.4 mg/dL (ref 0.0–1.2)
CO2: 22 mmol/L (ref 20–29)
CREATININE: 0.84 mg/dL (ref 0.57–1.00)
Calcium: 9.9 mg/dL (ref 8.7–10.3)
Chloride: 101 mmol/L (ref 96–106)
GFR calc non Af Amer: 76 mL/min/{1.73_m2} (ref >59)
GFR, EST AFRICAN AMERICAN: 87 mL/min/{1.73_m2} (ref >59)
GLOBULIN, TOTAL: 2.8 g/dL (ref 1.5–4.5)
Glucose: 99 mg/dL (ref 65–99)
Potassium: 4 mmol/L (ref 3.5–5.2)
SODIUM: 140 mmol/L (ref 134–144)
TOTAL PROTEIN: 7.7 g/dL (ref 6.0–8.5)

## 2017-04-30 LAB — PARATHYROID HORMONE, INTACT (NO CA): PTH: 30 pg/mL (ref 15–65)

## 2017-05-22 ENCOUNTER — Encounter: Payer: Self-pay | Admitting: Family Medicine

## 2017-05-22 ENCOUNTER — Other Ambulatory Visit: Payer: Self-pay

## 2017-05-22 ENCOUNTER — Ambulatory Visit: Payer: 59 | Admitting: Family Medicine

## 2017-05-22 VITALS — BP 128/82 | HR 97 | Temp 98.0°F | Resp 16 | Ht 67.52 in | Wt 186.0 lb

## 2017-05-22 DIAGNOSIS — G43009 Migraine without aura, not intractable, without status migrainosus: Secondary | ICD-10-CM

## 2017-05-22 DIAGNOSIS — I10 Essential (primary) hypertension: Secondary | ICD-10-CM

## 2017-05-22 DIAGNOSIS — Z6828 Body mass index (BMI) 28.0-28.9, adult: Secondary | ICD-10-CM | POA: Diagnosis not present

## 2017-05-22 MED ORDER — PHENTERMINE HCL 15 MG PO TBDP: 1.0000 | ORAL_TABLET | Freq: Every day | ORAL | 5 refills | Status: DC

## 2017-05-22 NOTE — Progress Notes (Signed)
Subjective:    Patient ID: Erin Peters, female    DOB: 1956/06/14, 61 y.o.   MRN: [id number]  05/22/2017  Migraine (1 month follow-up) and Weight Check    HPI This 62 y.o. female presents for evaluation of migraines, hypertension, and overweight status.  Patient reports good compliance with medication, good tolerance to medication, and good symptom control.  Tolerating medications without side effects.  Likes the cocktail.  No side effects.  No migraines.   Major stress at work; no headaches; major excitement. Food diary: B: yogurt or protein bar.   Lunch: salad, protein, vege S: protein, vege or yogurt. 5:30pm   Water, diet green tea. No snacks.1200 kcal. If gets hungry, will nibble on peanuts around 4:00pm. Exercise: not as much as wants to; exercising 30 minutes 3-4 days per week. So short staffed at work.   Starting weight: 193#. Had Thanksgiving lunch today; had a nice plate; slice of pecan pie.   BP Readings from Last 3 Encounters:  05/22/17 128/82  04/29/17 118/70  03/27/17 117/84   Wt Readings from Last 3 Encounters:  05/22/17 186 lb (84.4 kg)  04/29/17 190 lb (86.2 kg)  03/27/17 192 lb (87.1 kg)   Immunization History  Administered Date(s) Administered  . DTaP 07/09/2004  . Influenza Split 04/09/2015  . Influenza Whole 04/08/2013  . Influenza-Unspecified 04/08/2014, 04/08/2016  . Tdap 09/20/2016  . Zoster Recombinat (Shingrix) 12/21/2016    Review of Systems  Constitutional: Negative for chills, diaphoresis, fatigue and fever.  Eyes: Negative for visual disturbance.  Respiratory: Negative for cough and shortness of breath.   Cardiovascular: Negative for chest pain, palpitations and leg swelling.  Gastrointestinal: Negative for abdominal pain, constipation, diarrhea, nausea and vomiting.  Endocrine: Negative for cold intolerance, heat intolerance, polydipsia, polyphagia and polyuria.  Neurological: Negative for dizziness, tremors, seizures,  syncope, facial asymmetry, speech difficulty, weakness, light-headedness, numbness and headaches.    Past Medical History:  Diagnosis Date  . Arthritis    DDD lumbar  . Chronic kidney disease   . Constipation   . Hyperlipidemia   . Hypertension   . Migraine   . STD (sexually transmitted disease)    HSV   Past Surgical History:  Procedure Laterality Date  . ABDOMINAL HYSTERECTOMY     uterine prolapse; ovaries intact.  Marland Kitchen BREAST BIOPSY     right breast  . CHOLECYSTECTOMY    . HEMORRHOID SURGERY    . HERNIA REPAIR    . LUMBAR DISC SURGERY    . SPINE SURGERY    . STEROID INJECTION TO SCAR    . TONSILLECTOMY     No Known Allergies Current Outpatient Medications on File Prior to Visit  Medication Sig Dispense Refill  . ALPRAZolam (XANAX) 0.5 MG tablet Take 1 tablet (0.5 mg total) by mouth daily as needed for anxiety. 10 tablet 0  . atorvastatin (LIPITOR) 20 MG tablet Take 1 tablet (20 mg total) by mouth daily. 90 tablet 3  . Estradiol 10 MCG TABS vaginal tablet Place 1 tablet (10 mcg total) vaginally 2 (two) times a week. 8 tablet 11  . ibuprofen (ADVIL,MOTRIN) 800 MG tablet Take 1 tablet (800 mg total) by mouth every 8 (eight) hours as needed. 21 tablet 0  . LINZESS 145 MCG CAPS capsule Take 145 mcg by mouth daily.     . magnesium oxide (MAG-OX) 400 MG tablet Take 400 mg by mouth daily.    . Multiple Vitamin (MULTIVITAMIN) tablet Take 1 tablet by mouth daily.    Marland Kitchen  olmesartan-hydrochlorothiazide (BENICAR HCT) 40-12.5 MG tablet Take 1 tablet by mouth daily. 90 tablet 3  . topiramate (TOPAMAX) 25 MG tablet Take 1 tablet (25 mg total) by mouth 2 (two) times daily. 60 tablet 5  . traZODone (DESYREL) 50 MG tablet TAKE 1 TO 2 TABLETS BY  MOUTH AT BEDTIME AS NEEDED  FOR SLEEP 180 tablet 0   No current facility-administered medications on file prior to visit.    Social History   Socioeconomic History  . Marital status: Married    Spouse name: Not on file  . Number of children: 3    . Years of education: Not on file  . Highest education level: Not on file  Social Needs  . Financial resource strain: Not on file  . Food insecurity - worry: Not on file  . Food insecurity - inability: Not on file  . Transportation needs - medical: Not on file  . Transportation needs - non-medical: Not on file  Occupational History  . 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 Smoking status: Former Research scientist (life sciences)  . Smokeless tobacco: Never Used  . Tobacco comment: not sure of year, she was in her 20's, she was not an everyday smoker   Substance and Sexual Activity  . Alcohol use: No    Alcohol/week: 0.0 oz  . Drug use: No  . Sexual activity: Yes    Birth control/protection: Post-menopausal, Surgical  Other Topics Concern  . Not on file  Social History Narrative   Marital status: married x 42 years; happily; no abuse      Children: 2 children (daughter 80, son 64); grandchildren (46)      Lives:  With husband.      Employment:  [employer] x 17 years;payment processing;  happy moderate.      Tobacco: none.      Alcohol: none; rare      Exercise:  Walking/treadmill/elliptical/weights gym at [place].  Goal is 5 days per week; usually 3 days per week.      Seatbelt:  100%; no texting      Guns: loaded and secured.               Family History  Problem Relation Age of Onset  . Diabetes Mother   . Heart disease Mother 58       stents x 5; AMI age 63.  Marland Kitchen Hyperlipidemia Mother   . Hypertension Mother   . Dementia Mother   . Heart disease Maternal Grandmother   . Heart disease Maternal Grandfather        Objective:    BP 128/82   Pulse 97   Temp 98 F (36.7 C) (Oral)   Resp 16   Ht 5' 7.52" (1.715 m)   Wt 186 lb (84.4 kg)   SpO2 97%   BMI 28.68 kg/m  Physical Exam  Constitutional: She is oriented to person, place, and time. She appears well-developed and well-nourished. No distress.  HENT:   Head: Normocephalic and atraumatic.  Eyes: Conjunctivae are normal. Pupils are equal, round, and reactive to light.  Neck: Normal range of motion. Neck supple.  Cardiovascular: Normal rate, regular rhythm and normal heart sounds. Exam reveals no gallop and no friction rub.  No murmur heard. Pulmonary/Chest: Effort normal and breath sounds normal. She has no wheezes. She has no rales.  Neurological: She is alert and oriented to person, place, and time.  Skin: She is not diaphoretic.  Psychiatric: She has a normal mood and affect. Her behavior is normal.  Nursing note and vitals reviewed.  No results found. Depression screen [facility] 2/9 05/22/2017 04/29/2017 03/27/2017 01/02/2017 09/29/2016  Decreased Interest 0 0 0 0 0  Down, Depressed, Hopeless 0 0 0 0 0  PHQ - 2 Score 0 0 0 0 0   Fall Risk  05/22/2017 04/29/2017 03/27/2017 01/02/2017 09/29/2016  Falls in the past year? No No No No No  Number falls in past yr: - - - - -  Injury with [place]:   1. Migraine without aura and without status migrainosus, not intractable   2. Essential hypertension   3. BMI 28.0-28.9,adult    Tolerating Topamax for migraine prevention without side effects.  Continue current dose.  Hypertension well controlled despite addition of phentermine and Topamax.  Congratulations on weight loss.  Tolerating Topamax and phentermine regimen.  Continue current dose. Recommend weight loss, exercise for 30-60 minutes five days per week; recommend 1200 kcal restriction per day with a minimum of 60 grams of protein per day.   No orders of the defined types were placed in this encounter.  Meds ordered this encounter  Medications  . Phentermine HCl 15 MG TBDP    Sig: Take 1 tablet daily by mouth.    Dispense:  30 tablet    Refill:  5    No Follow-up on file.   Kristi Elayne Guerin, M.D. Primary Care at [facility] previously [facility] [place] [address] [place], [place]  [address] [phone] phone [phone] fax

## 2017-05-22 NOTE — Patient Instructions (Signed)
     IF you received an x-ray today, you will receive an invoice from [facility]. Please contact  Radiology at [phone] with questions or concerns regarding your invoice.   IF you received labwork today, you will receive an invoice from [facility]. Please contact [facility] at [phone] with questions or concerns regarding your invoice.   Our billing staff will not be able to assist you with questions regarding bills from these companies.  You will be contacted with the lab results as soon as they are available. The fastest way to get your results is to activate your My Chart account. Instructions are located on the last page of this paperwork. If you have not heard from us regarding the results in 2 weeks, please contact this office.     

## 2017-05-23 ENCOUNTER — Telehealth: Payer: Self-pay | Admitting: Family Medicine

## 2017-05-23 NOTE — Telephone Encounter (Signed)
Pt advised that this was not possible.

## 2017-05-23 NOTE — Telephone Encounter (Signed)
Pt  States   She  Was  Given  A  Written  rx for phentermine    On her  Last  Visit   She   States  It  Was  Left  At home   And    She   Is requesting   That  Her husband  Mr  Jelinek   Come  By the  [place] a  Duplicate  So she  Can  Get  It  Filled   Today  As  She  Is  In  [place]

## 2017-06-23 ENCOUNTER — Other Ambulatory Visit: Payer: Self-pay | Admitting: Family Medicine

## 2017-06-28 ENCOUNTER — Other Ambulatory Visit: Payer: Self-pay | Admitting: Family Medicine

## 2017-07-03 ENCOUNTER — Other Ambulatory Visit: Payer: Self-pay | Admitting: *Deleted

## 2017-07-03 ENCOUNTER — Other Ambulatory Visit: Payer: Self-pay | Admitting: Family Medicine

## 2017-07-03 ENCOUNTER — Telehealth: Payer: Self-pay | Admitting: Family Medicine

## 2017-07-03 ENCOUNTER — Encounter: Payer: Self-pay | Admitting: Family Medicine

## 2017-07-03 NOTE — Telephone Encounter (Signed)
Opened in error

## 2017-07-03 NOTE — Telephone Encounter (Deleted)
This medication refilled has failed twice in electronic submission / Pharmacy is unsure as to why /

## 2017-07-03 NOTE — Telephone Encounter (Signed)
Phone call to [facility] on [address]. Spoke with Cristie Hem regarding patient's Benicar 40-12.5mg . He states medication has not been received. Medication called in for patient, replied to patient via mychart.

## 2017-07-03 NOTE — Telephone Encounter (Signed)
Medication refill for olmesartan-hydrocholorthiazide / LOV 05/22/17 with Dr. Tamala Julian / Medication has failed x2 being sent electronically / Medication listed twice on med list / Please have provider verify

## 2017-07-04 ENCOUNTER — Other Ambulatory Visit: Payer: Self-pay

## 2017-07-04 MED ORDER — OLMESARTAN MEDOXOMIL-HCTZ 40-12.5 MG PO TABS: 1.0000 | ORAL_TABLET | Freq: Every day | ORAL | 0 refills | Status: DC

## 2017-07-04 NOTE — Telephone Encounter (Signed)
Resent Rx to pharmacy 07/04/2017.

## 2017-09-28 ENCOUNTER — Other Ambulatory Visit: Payer: Self-pay | Admitting: Family Medicine

## 2017-10-02 ENCOUNTER — Other Ambulatory Visit: Payer: Self-pay

## 2017-10-02 ENCOUNTER — Encounter: Payer: Self-pay | Admitting: Family Medicine

## 2017-10-02 ENCOUNTER — Ambulatory Visit (INDEPENDENT_AMBULATORY_CARE_PROVIDER_SITE_OTHER): Payer: 59 | Admitting: Family Medicine

## 2017-10-02 VITALS — BP 118/72 | HR 86 | Temp 98.0°F | Resp 16 | Ht 67.52 in | Wt 170.4 lb

## 2017-10-02 DIAGNOSIS — N8110 Cystocele, unspecified: Secondary | ICD-10-CM | POA: Diagnosis not present

## 2017-10-02 DIAGNOSIS — J0101 Acute recurrent maxillary sinusitis: Secondary | ICD-10-CM

## 2017-10-02 DIAGNOSIS — M5136 Other intervertebral disc degeneration, lumbar region: Secondary | ICD-10-CM | POA: Diagnosis not present

## 2017-10-02 DIAGNOSIS — R7302 Impaired glucose tolerance (oral): Secondary | ICD-10-CM

## 2017-10-02 DIAGNOSIS — Z0000 Encounter for general adult medical examination without abnormal findings: Secondary | ICD-10-CM

## 2017-10-02 DIAGNOSIS — Z124 Encounter for screening for malignant neoplasm of cervix: Secondary | ICD-10-CM

## 2017-10-02 DIAGNOSIS — I10 Essential (primary) hypertension: Secondary | ICD-10-CM | POA: Diagnosis not present

## 2017-10-02 DIAGNOSIS — J301 Allergic rhinitis due to pollen: Secondary | ICD-10-CM

## 2017-10-02 DIAGNOSIS — E663 Overweight: Secondary | ICD-10-CM

## 2017-10-02 DIAGNOSIS — M51369 Other intervertebral disc degeneration, lumbar region without mention of lumbar back pain or lower extremity pain: Secondary | ICD-10-CM

## 2017-10-02 DIAGNOSIS — Z6826 Body mass index (BMI) 26.0-26.9, adult: Secondary | ICD-10-CM

## 2017-10-02 DIAGNOSIS — E7800 Pure hypercholesterolemia, unspecified: Secondary | ICD-10-CM

## 2017-10-02 LAB — POCT URINALYSIS DIP (MANUAL ENTRY)
BILIRUBIN UA: NEGATIVE mg/dL
Blood, UA: NEGATIVE
Glucose, UA: NEGATIVE mg/dL
LEUKOCYTES UA: NEGATIVE
Nitrite, UA: NEGATIVE
PROTEIN UA: NEGATIVE mg/dL
Spec Grav, UA: 1.015 (ref 1.010–1.025)
Urobilinogen, UA: 0.2 E.U./dL
pH, UA: 7.5 (ref 5.0–8.0)

## 2017-10-02 MED ORDER — PHENTERMINE HCL 15 MG PO TBDP: 1.0000 | ORAL_TABLET | Freq: Every day | ORAL | 5 refills | Status: DC

## 2017-10-02 MED ORDER — ESTRADIOL 10 MCG VA TABS: 1.0000 | ORAL_TABLET | VAGINAL | 5 refills | Status: DC

## 2017-10-02 MED ORDER — AMOXICILLIN 500 MG PO TABS: 1000.0000 mg | ORAL_TABLET | Freq: Two times a day (BID) | ORAL | 0 refills | Status: DC

## 2017-10-02 MED ORDER — TOPIRAMATE 25 MG PO TABS: 25.0000 mg | ORAL_TABLET | Freq: Two times a day (BID) | ORAL | 3 refills | Status: DC

## 2017-10-02 MED ORDER — LINACLOTIDE 290 MCG PO CAPS: 290.0000 ug | ORAL_CAPSULE | Freq: Every day | ORAL | 3 refills | Status: DC

## 2017-10-02 MED ORDER — GABAPENTIN 100 MG PO CAPS: 100.0000 mg | ORAL_CAPSULE | Freq: Every day | ORAL | 5 refills | Status: DC

## 2017-10-02 MED ORDER — OLMESARTAN MEDOXOMIL-HCTZ 40-12.5 MG PO TABS: 1.0000 | ORAL_TABLET | Freq: Every day | ORAL | 3 refills | Status: DC

## 2017-10-02 MED ORDER — ATORVASTATIN CALCIUM 20 MG PO TABS: 20.0000 mg | ORAL_TABLET | Freq: Every day | ORAL | 3 refills | Status: DC

## 2017-10-02 NOTE — Progress Notes (Signed)
Subjective:    Patient ID: Erin Peters, female    DOB: 02-29-1956, 62 y.o.   MRN: [id number]  10/02/2017  Annual Exam    HPI This 62 y.o. female presents for COMPLETE PHYSICAL EXAMINATION.  Last physical:  09-20-2016 Pap smear:  06-10-2013 Mammogram:  08-14-16 Colonoscopy:  09-07-2016 Eye exam:  2019 Dental exam: every six months.    Visual Acuity Screening   Right eye Left eye Both eyes  Without correction:     With correction: 20/20 20/20 20/20     BP Readings from Last 3 Encounters:  10/02/17 118/72  05/22/17 128/82  04/29/17 118/70   Wt Readings from Last 3 Encounters:  10/02/17 170 lb 6.4 oz (77.3 kg)  05/22/17 186 lb (84.4 kg)  04/29/17 190 lb (86.2 kg)   Immunization History  Administered Date(s) Administered  . DTaP 07/09/2004  . Influenza Split 04/09/2015  . Influenza Whole 04/08/2013  . Influenza-Unspecified 04/08/2014, 04/08/2016, 04/08/2017  . Tdap 09/20/2016  . Zoster Recombinat (Shingrix) 12/21/2016, 04/05/2017   Health Maintenance  Topic Date Due  . INFLUENZA VACCINE  02/06/2018  . MAMMOGRAM  08/14/2018  . PAP SMEAR  10/02/2020  . COLONOSCOPY  09/08/2026  . TETANUS/TDAP  09/21/2026  . Hepatitis C Screening  Completed  . HIV Screening  Completed   Menopause: gradually weaning hormones; doing well; ready to wean.   Obesity: has lost 20 pounds; no improvement with Phentermine 15mg  capsule.  DDD lumbar:  Daughter in law recommended Neurontin for nerve pain and RLS. L leg with spiders and cramps.  Constipation: worsening.     Review of Systems  Constitutional: Negative for activity change, appetite change, chills, diaphoresis, fatigue, fever and unexpected weight change.  HENT: Negative for congestion, dental problem, drooling, ear discharge, ear pain, facial swelling, hearing loss, mouth sores, nosebleeds, postnasal drip, rhinorrhea, sinus pressure, sneezing, sore throat, tinnitus, trouble swallowing and voice change.   Eyes: Negative  for photophobia, pain, discharge, redness, itching and visual disturbance.  Respiratory: Negative for apnea, cough, choking, chest tightness, shortness of breath, wheezing and stridor.   Cardiovascular: Negative for chest pain, palpitations and leg swelling.  Gastrointestinal: Positive for constipation. Negative for abdominal distention, abdominal pain, anal bleeding, blood in stool, diarrhea, nausea, rectal pain and vomiting.  Endocrine: Negative for cold intolerance, heat intolerance, polydipsia, polyphagia and polyuria.  Genitourinary: Negative for decreased urine volume, difficulty urinating, dyspareunia, dysuria, enuresis, flank pain, frequency, genital sores, hematuria, menstrual problem, pelvic pain, urgency, vaginal bleeding, vaginal discharge and vaginal pain.       Nocturia x 1.  No urinary leakage.  Wears pessary.  Musculoskeletal: Positive for back pain. Negative for arthralgias, gait problem, joint swelling, myalgias, neck pain and neck stiffness.  Skin: Negative for color change, pallor, rash and wound.  Allergic/Immunologic: Negative for environmental allergies, food allergies and immunocompromised state.  Neurological: Positive for numbness. Negative for dizziness, tremors, seizures, syncope, facial asymmetry, speech difficulty, weakness, light-headedness and headaches.  Hematological: Negative for adenopathy. Does not bruise/bleed easily.  Psychiatric/Behavioral: Negative for agitation, behavioral problems, confusion, decreased concentration, dysphoric mood, hallucinations, self-injury, sleep disturbance and suicidal ideas. The patient is not nervous/anxious and is not hyperactive.        Bedtime 1000; wakes up 600.    Past Medical History:  Diagnosis Date  . Arthritis    DDD lumbar  . Chronic kidney disease   . Constipation   . Hyperlipidemia   . Hypertension   . Migraine   . STD (sexually transmitted disease)  HSV   Past Surgical History:  Procedure Laterality Date    . ABDOMINAL HYSTERECTOMY     uterine prolapse; ovaries intact.  Marland Kitchen BREAST BIOPSY     right breast  . CHOLECYSTECTOMY    . HEMORRHOID SURGERY    . HERNIA REPAIR    . LUMBAR DISC SURGERY    . SPINE SURGERY    . STEROID INJECTION TO SCAR    . TONSILLECTOMY     No Known Allergies Current Outpatient Medications on File Prior to Visit  Medication Sig Dispense Refill  . ALPRAZolam (XANAX) 0.5 MG tablet Take 1 tablet (0.5 mg total) by mouth daily as needed for anxiety. 10 tablet 0  . magnesium oxide (MAG-OX) 400 MG tablet Take 400 mg by mouth daily.    . Multiple Vitamin (MULTIVITAMIN) tablet Take 1 tablet by mouth daily.     No current facility-administered medications on file prior to visit.    Social History   Socioeconomic History  . Marital status: Married    Spouse name: Not on file  . Number of children: 3  . Years of education: Not on file  . Highest education level: Not on file  Occupational History  . 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 Financial resource strain: Not on file  . Food insecurity:    Worry: Not on file    Inability: Not on file  . Transportation needs:    Medical: Not on file    Non-medical: Not on file  Tobacco Use  . Smoking status: Former Research scientist (life sciences)  . Smokeless tobacco: Never Used  . Tobacco comment: not sure of year, she was in her 20's, she was not an everyday smoker   Substance and Sexual Activity  . Alcohol use: No    Alcohol/week: 0.0 oz  . Drug use: No  . Sexual activity: Yes    Birth control/protection: Post-menopausal, Surgical  Lifestyle  . Physical activity:    Days per week: Not on file    Minutes per session: Not on file  . Stress: Not on file  Relationships  . Social connections:    Talks on phone: Not on file    Gets together: Not on file    Attends religious service: Not on file    Active member of club or organization: Not on file    Attends  meetings of clubs or organizations: Not on file    Relationship status: Not on file  . Intimate partner violence:    Fear of current or ex partner: Not on file    Emotionally abused: Not on file    Physically abused: Not on file    Forced sexual activity: Not on file  Other Topics Concern  . Not on file  Social History Narrative   Marital status: married x 42 years; happily; no abuse      Children: 2 children (daughter 84, son 62); grandchildren (1)      Lives:  With husband.      Employment:  [employer] x 17 years;payment processing;  happy moderate.      Tobacco: none.      Alcohol: none; rare      Exercise:  Walking/treadmill/elliptical/weights gym at [place].  Goal is 5 days per week; usually 3 days per week.      Seatbelt:  100%; no texting      Guns: loaded and secured.  Family History  Problem Relation Age of Onset  . Diabetes Mother   . Heart disease Mother 45       stents x 5; AMI age 60.  Marland Kitchen Hyperlipidemia Mother   . Hypertension Mother   . Dementia Mother   . Heart disease Maternal Grandmother   . Heart disease Maternal Grandfather        Objective:    BP 118/72   Pulse 86   Temp 98 F (36.7 C) (Oral)   Resp 16   Ht 5' 7.52" (1.715 m)   Wt 170 lb 6.4 oz (77.3 kg)   SpO2 96%   BMI 26.28 kg/m  Physical Exam  Constitutional: She is oriented to person, place, and time. She appears well-developed and well-nourished. No distress.  HENT:  Head: Normocephalic and atraumatic.  Right Ear: Hearing, tympanic membrane, external ear and ear canal normal.  Left Ear: Hearing, tympanic membrane, external ear and ear canal normal.  Nose: Nose normal.  Mouth/Throat: Oropharynx is clear and moist.  Eyes: Pupils are equal, round, and reactive to light. Conjunctivae and EOM are normal.  Neck: Normal range of motion and full passive range of motion without pain. Neck supple. No JVD present. Carotid bruit is not present. No thyromegaly present.    Cardiovascular: Normal rate, regular rhythm, normal heart sounds and intact distal pulses. Exam reveals no gallop and no friction rub.  No murmur heard. Pulmonary/Chest: Effort normal and breath sounds normal. No respiratory distress. She has no wheezes. She has no rales. Right breast exhibits no inverted nipple, no mass, no nipple discharge, no skin change and no tenderness. Left breast exhibits no inverted nipple, no mass, no nipple discharge, no skin change and no tenderness. Breasts are symmetrical.  Abdominal: Soft. Bowel sounds are normal. She exhibits no distension and no mass. There is no tenderness. There is no rebound and no guarding.  Musculoskeletal:       Right shoulder: Normal.       Left shoulder: Normal.       Cervical back: Normal.  Lymphadenopathy:    She has no cervical adenopathy.  Neurological: She is alert and oriented to person, place, and time. She has normal reflexes. No cranial nerve deficit. She exhibits normal muscle tone. Coordination normal.  Skin: Skin is warm and dry. No rash noted. She is not diaphoretic. No erythema. No pallor.  Psychiatric: She has a normal mood and affect. Her behavior is normal. Judgment and thought content normal.  Nursing note and vitals reviewed.  No results found. Depression screen [facility] 2/9 10/02/2017 05/22/2017 04/29/2017 03/27/2017 01/02/2017  Decreased Interest 0 0 0 0 0  Down, Depressed, Hopeless 0 0 0 0 0  PHQ - 2 Score 0 0 0 0 0   Fall Risk  10/02/2017 05/22/2017 04/29/2017 03/27/2017 01/02/2017  Falls in the past year? No No No No No  Number falls in past yr: - - - - -  Injury with [place]:   1. Routine physical examination   2. Essential hypertension   3. Glucose intolerance (impaired glucose tolerance)   4. Pure hypercholesterolemia   5. Acute seasonal allergic rhinitis due to pollen   6. Pelvic relaxation due to vaginal prolapse   7. Cervical cancer screening   8.  DDD (degenerative disc disease), lumbar   9. Acute recurrent maxillary sinusitis   10. Overweight  11. BMI 26.0-26.9,adult    -anticipatory guidance provided --- exercise, weight loss, safe driving practices, aspirin 81mg  daily. -obtain age appropriate screening labs and labs for chronic disease management. -new onset acute maxillary sinusitis: New.  rx for amoxicillin provided. -overweight: improving; continue Topamax and Phentermine. Recommend weight loss, exercise for 30-60 minutes five days per week; recommend 1200 kcal restriction per day with a minimum of 60 grams of protein per day.  Eat 3 meals per day. Do not skip meals. Consider having a protein shake as a meal replacement to aid with eliminating meal skipping. Look for products with <220 calories, <7 gm sugar, and 20-30 gm protein.  Eat breakfast within 2 hours of getting up.   Make  your plate non-starchy vegetables,  protein, and  carbohydrates at lunch and dinner.   Aim for at least 64 oz. of calorie-free beverages daily (water, Crystal Light, diet green tea, etc.). Eliminate any sugary beverages such as regular soda, sweet tea, or fruit juice.   Pay attention to hunger and fullness cues.  Stop eating once you feel satisfied; don't wait until you feel full, stuffed, or sick from eating.  Choose lean meats and low fat/fat free dairy products.  Choose foods high in fiber such as fruits, vegetables, and whole grains (brown rice, whole wheat pasta, whole wheat bread, etc.).  Limit foods with added sugar to <7 gm per serving.  Always eat in the kitchen/dining room.  Never eat in the bedroom or in front of the TV.     Orders Placed This Encounter  Procedures  . CBC with Differential/Platelet  . Comprehensive metabolic panel    Order Specific Question:   Has the patient fasted?    Answer:   No  . Hemoglobin A1c  . Lipid panel    Order Specific Question:   Has the patient fasted?    Answer:   No  . TSH  . POCT  urinalysis dipstick  . EKG 12-Lead   Meds ordered this encounter  Medications  . DISCONTD: gabapentin (NEURONTIN) 100 MG capsule    Sig: Take 1-3 capsules (100-300 mg total) by mouth at bedtime.    Dispense:  90 capsule    Refill:  5  . linaclotide (LINZESS) 290 MCG CAPS capsule    Sig: Take 1 capsule (290 mcg total) by mouth daily before breakfast.    Dispense:  90 capsule    Refill:  3  . atorvastatin (LIPITOR) 20 MG tablet    Sig: Take 1 tablet (20 mg total) by mouth daily.    Dispense:  90 tablet    Refill:  3  . olmesartan-hydrochlorothiazide (BENICAR HCT) 40-12.5 MG tablet    Sig: Take 1 tablet by mouth daily.    Dispense:  90 tablet    Refill:  3  . DISCONTD: topiramate (TOPAMAX) 25 MG tablet    Sig: Take 1 tablet (25 mg total) by mouth 2 (two) times daily.    Dispense:  180 tablet    Refill:  3  . topiramate (TOPAMAX) 25 MG tablet    Sig: Take 1 tablet (25 mg total) by mouth 2 (two) times daily.    Dispense:  180 tablet    Refill:  3  . Estradiol 10 MCG TABS vaginal tablet    Sig: Place 1 tablet (10 mcg total) vaginally 2 (two) times a week.    Dispense:  8 tablet    Refill:  5  . DISCONTD: Phentermine HCl 15 MG TBDP  Sig: Take 1 tablet by mouth daily.    Dispense:  30 tablet    Refill:  5  . amoxicillin (AMOXIL) 500 MG tablet    Sig: Take 2 tablets (1,000 mg total) by mouth 2 (two) times daily.    Dispense:  40 tablet    Refill:  0    Return in about 6 months (around 04/04/2018) for follow-up chronic medical conditions.   Alixandra Alfieri Elayne Guerin, M.D. Primary Care at [facility] previously [facility] [place] [address] [place],   [address] [phone] phone [phone] fax

## 2017-10-02 NOTE — Patient Instructions (Addendum)
IF you received an x-ray today, you will receive an invoice from [facility]. Please contact [facility] at [phone] with questions or concerns regarding your invoice.   IF you received labwork today, you will receive an invoice from [facility]. Please contact [facility] at [phone] with questions or concerns regarding your invoice.   Our billing staff will not be able to assist you with questions regarding bills from these companies.  You will be contacted with the lab results as soon as they are available. The fastest way to get your results is to activate your My Chart account. Instructions are located on the last page of this paperwork. If you have not heard from [place] regarding the results in 2 weeks, please contact this office.      Preventive Care 40-64 Years, Female Preventive care refers to lifestyle choices and visits with your health care provider that can promote health and wellness. What does preventive care include?  A yearly physical exam. This is also called an annual well check.  Dental exams once or twice a year.  Routine eye exams. Ask your health care provider how often you should have your eyes checked.  Personal lifestyle choices, including: ? Daily care of your teeth and gums. ? Regular physical activity. ? Eating a healthy diet. ? Avoiding tobacco and drug use. ? Limiting alcohol use. ? Practicing safe sex. ? Taking low-dose aspirin daily starting at age 34. ? Taking vitamin and mineral supplements as recommended by your health care provider. What happens during an annual well check? The services and screenings done by your health care provider during your annual well check will depend on your age, overall health, lifestyle risk factors, and family history of disease. Counseling Your health care provider may ask you questions about your:  Alcohol use.  Tobacco use.  Drug use.  Emotional well-being.  Home and relationship  well-being.  Sexual activity.  Eating habits.  Work and work Statistician.  Method of birth control.  Menstrual cycle.  Pregnancy history.  Screening You may have the following tests or measurements:  Height, weight, and BMI.  Blood pressure.  Lipid and cholesterol levels. These may be checked every 5 years, or more frequently if you are over 65 years old.  Skin check.  Lung cancer screening. You may have this screening every year starting at age 102 if you have a 30-pack-year history of smoking and currently smoke or have quit within the past 15 years.  Fecal occult blood test (FOBT) of the stool. You may have this test every year starting at age 33.  Flexible sigmoidoscopy or colonoscopy. You may have a sigmoidoscopy every 5 years or a colonoscopy every 10 years starting at age 23.  Hepatitis C blood test.  Hepatitis B blood test.  Sexually transmitted disease (STD) testing.  Diabetes screening. This is done by checking your blood sugar (glucose) after you have not eaten for a while (fasting). You may have this done every 1-3 years.  Mammogram. This may be done every 1-2 years. Talk to your health care provider about when you should start having regular mammograms. This may depend on whether you have a family history of breast cancer.  BRCA-related cancer screening. This may be done if you have a family history of breast, ovarian, tubal, or peritoneal cancers.  Pelvic exam and Pap test. This may be done every 3 years starting at age 3. Starting at age 55, this may be done every 5 years if  you have a Pap test in combination with an HPV test.  Bone density scan. This is done to screen for osteoporosis. You may have this scan if you are at high risk for osteoporosis.  Discuss your test results, treatment options, and if necessary, the need for more tests with your health care provider. Vaccines Your health care provider may recommend certain vaccines, such  as:  Influenza vaccine. This is recommended every year.  Tetanus, diphtheria, and acellular pertussis (Tdap, Td) vaccine. You may need a Td booster every 10 years.  Varicella vaccine. You may need this if you have not been vaccinated.  Zoster vaccine. You may need this after age 77.  Measles, mumps, and rubella (MMR) vaccine. You may need at least one dose of MMR if you were born in 1957 or later. You may also need a second dose.  Pneumococcal 13-valent conjugate (PCV13) vaccine. You may need this if you have certain conditions and were not previously vaccinated.  Pneumococcal polysaccharide (PPSV23) vaccine. You may need one or two doses if you smoke cigarettes or if you have certain conditions.  Meningococcal vaccine. You may need this if you have certain conditions.  Hepatitis A vaccine. You may need this if you have certain conditions or if you travel or work in places where you may be exposed to hepatitis A.  Hepatitis B vaccine. You may need this if you have certain conditions or if you travel or work in places where you may be exposed to hepatitis B.  Haemophilus influenzae type b (Hib) vaccine. You may need this if you have certain conditions.  Talk to your health care provider about which screenings and vaccines you need and how often you need them. This information is not intended to replace advice given to you by your health care provider. Make sure you discuss any questions you have with your health care provider. Document Released: 07/22/2015 Document Revised: 03/14/2016 Document Reviewed: 04/26/2015 Elsevier Interactive Patient Education  [employer].

## 2017-10-03 ENCOUNTER — Telehealth: Payer: Self-pay | Admitting: *Deleted

## 2017-10-03 LAB — TSH: TSH: 0.418 u[IU]/mL — ABNORMAL LOW (ref 0.450–4.500)

## 2017-10-03 LAB — HEMOGLOBIN A1C
ESTIMATED AVERAGE GLUCOSE: 123 mg/dL
Hgb A1c MFr Bld: 5.9 % — ABNORMAL HIGH (ref 4.8–5.6)

## 2017-10-03 LAB — COMPREHENSIVE METABOLIC PANEL
ALT: 15 IU/L (ref 0–32)
AST: 16 IU/L (ref 0–40)
Albumin/Globulin Ratio: 1.6 (ref 1.2–2.2)
Albumin: 4.7 g/dL (ref 3.6–4.8)
Alkaline Phosphatase: 89 IU/L (ref 39–117)
BUN/Creatinine Ratio: 15 (ref 12–28)
BUN: 11 mg/dL (ref 8–27)
Bilirubin Total: 0.4 mg/dL (ref 0.0–1.2)
CALCIUM: 10.3 mg/dL (ref 8.7–10.3)
CO2: 24 mmol/L (ref 20–29)
CREATININE: 0.71 mg/dL (ref 0.57–1.00)
Chloride: 107 mmol/L — ABNORMAL HIGH (ref 96–106)
GFR calc Af Amer: 106 mL/min/{1.73_m2} (ref >59)
GFR, EST NON AFRICAN AMERICAN: 92 mL/min/{1.73_m2} (ref >59)
Globulin, Total: 2.9 g/dL (ref 1.5–4.5)
Glucose: 93 mg/dL (ref 65–99)
Potassium: 4.7 mmol/L (ref 3.5–5.2)
Sodium: 145 mmol/L — ABNORMAL HIGH (ref 134–144)
Total Protein: 7.6 g/dL (ref 6.0–8.5)

## 2017-10-03 LAB — CBC WITH DIFFERENTIAL/PLATELET
Basophils Absolute: 0 10*3/uL (ref 0.0–0.2)
Basos: 0 %
EOS (ABSOLUTE): 0 10*3/uL (ref 0.0–0.4)
EOS: 1 %
HEMATOCRIT: 42.3 % (ref 34.0–46.6)
Hemoglobin: 14.1 g/dL (ref 11.1–15.9)
IMMATURE GRANULOCYTES: 0 %
Immature Grans (Abs): 0 10*3/uL (ref 0.0–0.1)
Lymphocytes Absolute: 2.2 10*3/uL (ref 0.7–3.1)
Lymphs: 40 %
MCH: 30.1 pg (ref 26.6–33.0)
MCHC: 33.3 g/dL (ref 31.5–35.7)
MCV: 90 fL (ref 79–97)
MONOS ABS: 0.4 10*3/uL (ref 0.1–0.9)
Monocytes: 8 %
NEUTROS PCT: 51 %
Neutrophils Absolute: 2.8 10*3/uL (ref 1.4–7.0)
PLATELETS: 348 10*3/uL (ref 150–379)
RBC: 4.68 x10E6/uL (ref 3.77–5.28)
RDW: 13.4 % (ref 12.3–15.4)
WBC: 5.4 10*3/uL (ref 3.4–10.8)

## 2017-10-03 LAB — LIPID PANEL
CHOLESTEROL TOTAL: 167 mg/dL (ref 100–199)
Chol/HDL Ratio: 3.9 ratio (ref 0.0–4.4)
HDL: 43 mg/dL (ref >39)
LDL Calculated: 81 mg/dL (ref 0–99)
Triglycerides: 216 mg/dL — ABNORMAL HIGH (ref 0–149)
VLDL Cholesterol Cal: 43 mg/dL — ABNORMAL HIGH (ref 5–40)

## 2017-10-03 NOTE — Telephone Encounter (Signed)
Pa submitted 10/03/2017 linzess KEY: [id number]

## 2017-10-04 LAB — PAP IG AND HPV HIGH-RISK
HPV, high-risk: NEGATIVE
PAP Smear Comment: 0

## 2017-10-08 ENCOUNTER — Telehealth: Payer: Self-pay

## 2017-10-08 NOTE — Telephone Encounter (Signed)
PA for Linzess 250mcg capsules approved through 10/04/2018.  [place] [id number].  [facility] notified and states Rx was sent out yesterday, 04/01.

## 2017-11-06 ENCOUNTER — Other Ambulatory Visit: Payer: Self-pay | Admitting: Family Medicine

## 2017-11-06 DIAGNOSIS — Z1231 Encounter for screening mammogram for malignant neoplasm of breast: Secondary | ICD-10-CM

## 2017-11-21 ENCOUNTER — Other Ambulatory Visit: Payer: Self-pay | Admitting: Family Medicine

## 2017-11-26 ENCOUNTER — Other Ambulatory Visit: Payer: Self-pay | Admitting: Family Medicine

## 2017-11-27 ENCOUNTER — Ambulatory Visit: Payer: 59

## 2017-11-27 NOTE — Telephone Encounter (Signed)
Phentermine refill Last OV: 10/02/17 Last Refill:10/02/17 #30 tab 5 RF Pharmacy:[facility] [address] PCP: Reginia Forts MD

## 2017-12-02 ENCOUNTER — Encounter: Payer: Self-pay | Admitting: Family Medicine

## 2017-12-05 ENCOUNTER — Telehealth: Payer: Self-pay | Admitting: Family Medicine

## 2017-12-05 NOTE — Telephone Encounter (Signed)
I left a voicemail in regards to cancelling/rescheduling her appt with Dr. Tamala Julian on 04/02/2018 due to provider leaving.

## 2017-12-05 NOTE — Telephone Encounter (Signed)
Copied from [place] [id number]. Topic: Quick Communication - See Telephone Encounter >> Dec 05, 2017 11:45 AM Antonieta Iba C wrote: CRM for notification. See Telephone encounter for: 12/05/17.   Pt says that she received the call to reschedule apt, pt would like to see Dr. Tamala Julian before she leave. Pt would like to know if provider could work her in for her follow up before she leaves the office.   Please advise.    Spoke with pt.  Attempted to make appt.  Pt in [place].  Not due back in [place] until after June 10-  Advised to call and see if "same day appt" is available.

## 2017-12-20 DIAGNOSIS — L820 Inflamed seborrheic keratosis: Secondary | ICD-10-CM | POA: Diagnosis not present

## 2017-12-20 DIAGNOSIS — L720 Epidermal cyst: Secondary | ICD-10-CM | POA: Diagnosis not present

## 2017-12-24 ENCOUNTER — Ambulatory Visit
Admission: RE | Admit: 2017-12-24 | Discharge: 2017-12-24 | Disposition: A | Discharge status: Home / Self Care | Payer: 59 | Source: Ambulatory Visit | Attending: Family Medicine | Admitting: Family Medicine

## 2017-12-24 DIAGNOSIS — Z1231 Encounter for screening mammogram for malignant neoplasm of breast: Secondary | ICD-10-CM

## 2018-01-06 ENCOUNTER — Ambulatory Visit: Payer: Self-pay | Admitting: Family Medicine

## 2018-01-06 NOTE — Telephone Encounter (Signed)
Pt c/o intermittent lightheadedness since Saturday. Pt states Saturday she began to feel dizzy after bending over. Sunday she was shopping with her husband and began to feel lightheaded. Today at 0800 felt lightheaded and then the sx went away. Sx came back at 1100 feels lightheaded again.  Pt states a change in head position makes her "see stars."HR 84.  Care advice given and appt made for pt Tuesday at 1200 with PCP.   Reason for Disposition . [1] MODERATE dizziness (e.g., interferes with normal activities) AND [2] has NOT been evaluated by physician for this  (Exception: dizziness caused by heat exposure, sudden standing, or poor fluid intake)  Answer Assessment - Initial Assessment Questions 1. DESCRIPTION: "Describe your dizziness."     Feels lightheaded past 3 days makes her feel like she is a little unsteady 2. LIGHTHEADED: "Do you feel lightheaded?" (e.g., somewhat faint, woozy, weak upon standing)     yes 3. VERTIGO: "Do you feel like either you or the room is spinning or tilting?" (i.e. vertigo)     no 4. SEVERITY: "How bad is it?"  "Do you feel like you are going to faint?" "Can you stand and walk?"   - MILD - walking normally   - MODERATE - interferes with normal activities (e.g., work, school)    - SEVERE - unable to stand, requires support to walk, feels like passing out now.      moderate 5. ONSET:  "When did the dizziness begin?"     Saturday 6. AGGRAVATING FACTORS: "Does anything make it worse?" (e.g., standing, change in head position)     Change in head position made her see stars 7. HEART RATE: "Can you tell me your heart rate?" "How many beats in 15 seconds?"  (Note: not all patients can do this)       21  in 15 seconds = 84 BPM 8. CAUSE: "What do you think is causing the dizziness?"     no 9. RECURRENT SYMPTOM: "Have you had dizziness before?" If so, ask: "When was the last time?" "What happened that time?"     no 10. OTHER SYMPTOMS: "Do you have any other symptoms?"  (e.g., fever, chest pain, vomiting, diarrhea, bleeding)       no 11. PREGNANCY: "Is there any chance you are pregnant?" "When was your last menstrual period?"       n/a  Protocols used: DIZZINESS [facility]

## 2018-01-07 ENCOUNTER — Other Ambulatory Visit: Payer: Self-pay

## 2018-01-07 ENCOUNTER — Ambulatory Visit: Payer: 59 | Admitting: Family Medicine

## 2018-01-07 ENCOUNTER — Encounter: Payer: Self-pay | Admitting: Family Medicine

## 2018-01-07 VITALS — BP 122/80 | HR 88 | Temp 99.0°F | Resp 16 | Ht 67.52 in | Wt 169.8 lb

## 2018-01-07 DIAGNOSIS — I10 Essential (primary) hypertension: Secondary | ICD-10-CM

## 2018-01-07 DIAGNOSIS — R55 Syncope and collapse: Secondary | ICD-10-CM | POA: Diagnosis not present

## 2018-01-07 LAB — GLUCOSE, POCT (MANUAL RESULT ENTRY): POC GLUCOSE: 81 mg/dL (ref 70–99)

## 2018-01-07 LAB — POCT URINALYSIS DIP (MANUAL ENTRY)
BILIRUBIN UA: NEGATIVE mg/dL
Glucose, UA: NEGATIVE mg/dL
LEUKOCYTES UA: NEGATIVE
NITRITE UA: NEGATIVE
Protein Ur, POC: NEGATIVE mg/dL
RBC UA: NEGATIVE
UROBILINOGEN UA: 0.2 U/dL
pH, UA: 5.5 (ref 5.0–8.0)

## 2018-01-07 NOTE — Progress Notes (Signed)
Subjective:    Patient ID: MILEIDY ATKIN, female    DOB: March 14, 1956, 62 y.o.   MRN: [id number]  01/07/2018  Dizziness (no dizziness, no N&V just feels light she is going to pass out started Friday )    HPI This 62 y.o. female presents for evaluation of NEAR SYNCOPE for the past four days.  Bent over and stood up and saw stars; then started feeling better. Husband recommended checking BP. The next day, went out and air conditioner went out; sitting out in shade.  Bent over to get husband some tools; felt lightheaded.  The room is not spinning; no other symptoms but feels like going to faint.   The next day, went to lunch and ate.  Went to [employer] to get groceries and felt really lightheaded. Had not bent over that time. First episode duration 5 minutes; second episode 30 minute duration; third episode duration 30 minutes. Finished grocery shopping and felt fine.   Yesterday got to work; at 8:00am standing at work and felt like going to pass out again.  Tried to stand there and get self composed so that could walk to office area to sit down.  Felt washed out.  First time that had occurred.  At 10:00am same spell occurred while sitting at desk.  Called office yesterday. All day yesterday felt washed out; did not feel good. Today, getting lightheadedness every hour.  Worse today.  No other symptoms.   Really washed out.   No headache; no blurred vision; eyes are cloudy but once lightheaded sensation resolved; no chest pain; no palpitations; no shortness of breath; no nausea, vomiting, diarrhea.  No dysuria; no concentrated urine. Appetite normal. Still dieting; eating but not starving; eating 1200-1500 kcal per day.  Drinking plenty of fluids; drinks all day.   Has not checked BP during episodes. Sleeping great with Neurontin for RLS; usually takes two daily.   Stress at work but not out of the ordinary. No regular exercise.  No exercise in three months.  Walking at that time;  stationary bike; 3-4 days per week. Ordered a blood pressure machine; obtain upper arm machine.   BP Readings from Last 3 Encounters:  01/07/18 122/80  10/02/17 118/72  05/22/17 128/82   Wt Readings from Last 3 Encounters:  01/07/18 169 lb 12.8 oz (77 kg)  10/02/17 170 lb 6.4 oz (77.3 kg)  05/22/17 186 lb (84.4 kg)   Immunization History  Administered Date(s) Administered  . DTaP 07/09/2004  . Influenza Split 04/09/2015  . Influenza Whole 04/08/2013  . Influenza-Unspecified 04/08/2014, 04/08/2016, 04/08/2017  . Tdap 09/20/2016  . Zoster Recombinat (Shingrix) 12/21/2016, 04/05/2017    Review of Systems  Constitutional: Negative for activity change, appetite change, chills, diaphoresis, fatigue, fever and unexpected weight change.  HENT: Negative for congestion, dental problem, drooling, ear discharge, ear pain, facial swelling, hearing loss, mouth sores, nosebleeds, postnasal drip, rhinorrhea, sinus pressure, sneezing, sore throat, tinnitus, trouble swallowing and voice change.   Eyes: Negative for photophobia, pain, discharge, redness, itching and visual disturbance.  Respiratory: Negative for apnea, cough, choking, chest tightness, shortness of breath, wheezing and stridor.   Cardiovascular: Negative for chest pain, palpitations and leg swelling.  Gastrointestinal: Negative for abdominal distention, abdominal pain, anal bleeding, blood in stool, constipation, diarrhea, nausea, rectal pain and vomiting.  Endocrine: Negative for cold intolerance, heat intolerance, polydipsia, polyphagia and polyuria.  Genitourinary: Negative for decreased urine volume, difficulty urinating, dyspareunia, dysuria, enuresis, flank pain, frequency, genital sores, hematuria, menstrual problem,  pelvic pain, urgency, vaginal bleeding, vaginal discharge and vaginal pain.  Musculoskeletal: Negative for arthralgias, back pain, gait problem, joint swelling, myalgias, neck pain and neck stiffness.  Skin:  Negative for color change, pallor, rash and wound.  Allergic/Immunologic: Negative for environmental allergies, food allergies and immunocompromised state.  Neurological: Positive for dizziness and light-headedness. Negative for tremors, seizures, syncope, facial asymmetry, speech difficulty, weakness, numbness and headaches.  Hematological: Negative for adenopathy. Does not bruise/bleed easily.  Psychiatric/Behavioral: Negative for agitation, behavioral problems, confusion, decreased concentration, dysphoric mood, hallucinations, self-injury, sleep disturbance and suicidal ideas. The patient is not nervous/anxious and is not hyperactive.     Past Medical History:  Diagnosis Date  . Arthritis    DDD lumbar  . Chronic kidney disease   . Constipation   . Hyperlipidemia   . Hypertension   . Migraine   . STD (sexually transmitted disease)    HSV   Past Surgical History:  Procedure Laterality Date  . ABDOMINAL HYSTERECTOMY     uterine prolapse; ovaries intact.  Marland Kitchen BREAST CYST ASPIRATION Right   . CHOLECYSTECTOMY    . HEMORRHOID SURGERY    . HERNIA REPAIR    . LUMBAR DISC SURGERY    . SPINE SURGERY    . STEROID INJECTION TO SCAR    . TONSILLECTOMY     No Known Allergies Current Outpatient Medications on File Prior to Visit  Medication Sig Dispense Refill  . ALPRAZolam (XANAX) 0.5 MG tablet Take 1 tablet (0.5 mg total) by mouth daily as needed for anxiety. 10 tablet 0  . atorvastatin (LIPITOR) 20 MG tablet Take 1 tablet (20 mg total) by mouth daily. 90 tablet 3  . Estradiol 10 MCG TABS vaginal tablet Place 1 tablet (10 mcg total) vaginally 2 (two) times a week. 8 tablet 5  . gabapentin (NEURONTIN) 100 MG capsule TAKE 1 TO 3 CAPSULES BY MOUTH AT BEDTIME. 270 capsule 1  . linaclotide (LINZESS) 290 MCG CAPS capsule Take 1 capsule (290 mcg total) by mouth daily before breakfast. 90 capsule 3  . magnesium oxide (MAG-OX) 400 MG tablet Take 400 mg by mouth daily.    . Multiple Vitamin  (MULTIVITAMIN) tablet Take 1 tablet by mouth daily.    Marland Kitchen olmesartan-hydrochlorothiazide (BENICAR HCT) 40-12.5 MG tablet Take 1 tablet by mouth daily. 90 tablet 3  . phentermine 15 MG capsule TAKE ONE CAPSULE BY MOUTH EVERY DAY 30 capsule 5  . topiramate (TOPAMAX) 25 MG tablet Take 1 tablet (25 mg total) by mouth 2 (two) times daily. 180 tablet 3  . amoxicillin (AMOXIL) 500 MG tablet Take 2 tablets (1,000 mg total) by mouth 2 (two) times daily. (Patient not taking: Reported on 01/07/2018) 40 tablet 0   No current facility-administered medications on file prior to visit.    Social History   Socioeconomic History  . Marital status: Married    Spouse name: Not on file  . Number of children: 3  . Years of education: Not on file  . Highest education level: Not on file  Occupational History  . 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 Financial resource strain: Not on file  . Food insecurity:    Worry: Not on file    Inability: Not on file  . Transportation needs:    Medical: Not on file    Non-medical: Not on file  Tobacco Use  . Smoking status: Former Research scientist (life sciences)  .  Smokeless tobacco: Never Used  . Tobacco comment: not sure of year, she was in her 20's, she was not an everyday smoker   Substance and Sexual Activity  . Alcohol use: No    Alcohol/week: 0.0 oz  . Drug use: No  . Sexual activity: Yes    Birth control/protection: Post-menopausal, Surgical  Lifestyle  . Physical activity:    Days per week: Not on file    Minutes per session: Not on file  . Stress: Not on file  Relationships  . Social connections:    Talks on phone: Not on file    Gets together: Not on file    Attends religious service: Not on file    Active member of club or organization: Not on file    Attends meetings of clubs or organizations: Not on file    Relationship status: Not on file  . Intimate partner violence:    Fear of current or  ex partner: Not on file    Emotionally abused: Not on file    Physically abused: Not on file    Forced sexual activity: Not on file  Other Topics Concern  . Not on file  Social History Narrative   Marital status: married x 42 years; happily; no abuse      Children: 2 children (daughter 21, son 65); grandchildren (64)      Lives:  With husband.      Employment:  [employer] x 17 years;payment processing;  happy moderate.      Tobacco: none.      Alcohol: none; rare      Exercise:  Walking/treadmill/elliptical/weights gym at [place].  Goal is 5 days per week; usually 3 days per week.      Seatbelt:  100%; no texting      Guns: loaded and secured.               Family History  Problem Relation Age of Onset  . Diabetes Mother   . Heart disease Mother 82       stents x 5; AMI age 31.  Marland Kitchen Hyperlipidemia Mother   . Hypertension Mother   . Dementia Mother   . Heart disease Maternal Grandmother   . Heart disease Maternal Grandfather        Objective:    BP 122/80   Pulse 88   Temp 99 F (37.2 C)   Resp 16   Ht 5' 7.52" (1.715 m)   Wt 169 lb 12.8 oz (77 kg)   SpO2 97%   BMI 26.19 kg/m  Physical Exam  Constitutional: She is oriented to person, place, and time. She appears well-developed and well-nourished. No distress.  HENT:  Head: Normocephalic and atraumatic.  Right Ear: External ear normal.  Left Ear: External ear normal.  Nose: Nose normal.  Mouth/Throat: Oropharynx is clear and moist.  Eyes: Pupils are equal, round, and reactive to light. Conjunctivae and EOM are normal.  Neck: Normal range of motion and full passive range of motion without pain. Neck supple. No JVD present. Carotid bruit is not present. No thyromegaly present.  Cardiovascular: Normal rate, regular rhythm, normal heart sounds and intact distal pulses. Exam reveals no gallop and no friction rub.  No murmur heard. Pulmonary/Chest: Effort normal and breath sounds normal. She has no wheezes. She has  no rales.  Abdominal: Soft. Bowel sounds are normal. She exhibits no distension and no mass. There is no tenderness. There is no rebound and no guarding.  Musculoskeletal:       Right shoulder: Normal.       Left shoulder: Normal.       Cervical back: Normal.  Lymphadenopathy:    She has no cervical adenopathy.  Neurological: She is alert and oriented to person, place, and time. She has normal reflexes. She displays normal reflexes. No cranial nerve deficit or sensory deficit. She exhibits normal muscle tone. Coordination normal.  Skin: Skin is warm and dry. No rash noted. She is not diaphoretic. No erythema. No pallor.  Psychiatric: She has a normal mood and affect. Her behavior is normal. Judgment and thought content normal.  Nursing note and vitals reviewed.  No results found. Depression screen [facility] 2/9 01/07/2018 10/02/2017 05/22/2017 04/29/2017 03/27/2017  Decreased Interest 0 0 0 0 0  Down, Depressed, Hopeless 0 0 0 0 0  PHQ - 2 Score 0 0 0 0 0   Fall Risk  01/07/2018 10/02/2017 05/22/2017 04/29/2017 03/27/2017  Falls in the past year? No No No No No  Number falls in past yr: - - - - -  Injury with Fall? - - - - -  Comment - - - - -    Results for orders placed or performed in visit on 01/07/18  POCT urinalysis dipstick  Result Value Ref Range   Color, UA yellow yellow   Clarity, UA clear clear   Glucose, UA negative negative mg/dL   Bilirubin, UA large (A) negative   Ketones, POC UA negative negative mg/dL   Spec Grav, UA <=1.005 (A) 1.010 - 1.025   Blood, UA negative negative   pH, UA 5.5 5.0 - 8.0   Protein Ur, POC negative negative mg/dL   Urobilinogen, UA 0.2 0.2 or 1.0 E.U./dL   Nitrite, UA Negative Negative   Leukocytes, UA Negative Negative   Orthostatic VS for the past 24 hrs:  BP- Lying Pulse- Lying BP- Sitting Pulse- Sitting BP- Standing at 0 minutes Pulse- Standing at 0 minutes  01/07/18 1235 127/83 86 123/86 86 132/89 91    EKG: NSR; no ST changes; no  arrhythmia.    Assessment & Plan:   1. Near syncope   2. Essential hypertension     New onset.  Etiology unclear yet urinalysis is normal; orthostatics normal; EKG is normal.  Concern for orthostatic hypotension; patient with recent weight loss thus suspect lower blood pressure readings at home; recommend checking blood pressure every day; decrease antihypertensive to 1/2 tablet daily; present to emergency department immediately for syncopal event.  Close follow-up in one week.  Orders Placed This Encounter  Procedures  . Urine Culture    Order Specific Question:   Source    Answer:   clean catch  . Comprehensive metabolic panel  . TSH  . CBC with Differential/Platelet  . POCT glucose (manual entry)  . POCT urinalysis dipstick  . EKG 12-Lead   No orders of the defined types were placed in this encounter.   No follow-ups on file.   Bular Hickok Elayne Guerin, M.D. Primary Care at [facility] [address] [place], [place]  [phone] phone [phone] fax

## 2018-01-07 NOTE — Patient Instructions (Addendum)
Check blood pressure every day; also check blood pressure when you feel like you are going to pass out. If you pass out, you must present immediately to the emergency room. For the next week, only take 1/2 tablet of blood pressure medication. Return in one week for follow-up visit.   IF you received an x-ray today, you will receive an invoice from [facility]. Please contact [facility] at [phone] with questions or concerns regarding your invoice.   IF you received labwork today, you will receive an invoice from [facility]. Please contact [facility] at [phone] with questions or concerns regarding your invoice.   Our billing staff will not be able to assist you with questions regarding bills from these companies.  You will be contacted with the lab results as soon as they are available. The fastest way to get your results is to activate your My Chart account. Instructions are located on the last page of this paperwork. If you have not heard from [place] regarding the results in 2 weeks, please contact this office.      Near-Syncope Near-syncope is when you suddenly become weak or dizzy, or you feel like you might pass out (faint). During an episode of near-syncope, you may:  Feel dizzy or light-headed.  Feel nauseous.  See all white or all black in your field of vision.  Have cold, clammy skin.  This condition is caused by a sudden decrease in blood flow to the brain. This decrease can result from various causes, but most of those causes are not dangerous. However, near-syncope can be a sign of a serious medical problem, so it is important to seek medical care. If you fainted, get medical help right away.Call your local emergency services (911 in the [place] Do not drive yourself to the hospital. Follow these instructions at home: Pay attention to any changes in your symptoms. Take these actions to help with your condition:  Have someone stay with you until you  feel stable.  Do not drive, use machinery, or play sports until your health care provider says it is okay.  Keep all follow-up visits as told by your health care provider. This is important.  If you start to feel like you might faint, lie down right away and raise (elevate) your feet above the level of your heart. Breathe deeply and steadily. Wait until all of the symptoms have passed.  Drink enough fluid to keep your urine clear or pale yellow.  If you are taking blood pressure or heart medicine, get up slowly and take several minutes to sit and then stand. This can reduce dizziness.  Take over-the-counter and prescription medicines only as told by your health care provider.  Get help right away if:  You have a severe headache.  You have unusual pain in your chest, abdomen, or back.  You are bleeding from your mouth or rectum, or you have black or tarry stool.  You have a very fast or irregular heartbeat (palpitations).  You faint once or repeatedly.  You have a seizure.  You are confused.  You have trouble walking.  You have severe weakness.  You have vision problems. These symptoms may represent a serious problem that is an emergency. Do not wait to see if your symptoms will go away. Get medical help right away. Call your local emergency services (911 in the [place] Do not drive yourself to the hospital. This information is not intended to replace advice given to you by your health care provider.  Make sure you discuss any questions you have with your health care provider. Document Released: 06/25/2005 Document Revised: 12/01/2015 Document Reviewed: 03/09/2015 Elsevier Interactive Patient Education  2017 [employer].

## 2018-01-08 LAB — URINE CULTURE

## 2018-01-08 LAB — COMPREHENSIVE METABOLIC PANEL
ALBUMIN: 5.3 g/dL — AB (ref 3.6–4.8)
ALK PHOS: 104 IU/L (ref 39–117)
ALT: 14 IU/L (ref 0–32)
AST: 20 IU/L (ref 0–40)
Albumin/Globulin Ratio: 2 (ref 1.2–2.2)
BUN / CREAT RATIO: 21 (ref 12–28)
BUN: 15 mg/dL (ref 8–27)
Bilirubin Total: 0.4 mg/dL (ref 0.0–1.2)
CO2: 21 mmol/L (ref 20–29)
CREATININE: 0.71 mg/dL (ref 0.57–1.00)
Calcium: 10 mg/dL (ref 8.7–10.3)
Chloride: 101 mmol/L (ref 96–106)
GFR calc Af Amer: 106 mL/min/{1.73_m2} (ref >59)
GFR calc non Af Amer: 92 mL/min/{1.73_m2} (ref >59)
GLUCOSE: 95 mg/dL (ref 65–99)
Globulin, Total: 2.6 g/dL (ref 1.5–4.5)
Potassium: 3.8 mmol/L (ref 3.5–5.2)
Sodium: 140 mmol/L (ref 134–144)
Total Protein: 7.9 g/dL (ref 6.0–8.5)

## 2018-01-08 LAB — TSH: TSH: 0.602 u[IU]/mL (ref 0.450–4.500)

## 2018-01-08 LAB — CBC WITH DIFFERENTIAL/PLATELET
BASOS ABS: 0 10*3/uL (ref 0.0–0.2)
Basos: 1 %
EOS (ABSOLUTE): 0 10*3/uL (ref 0.0–0.4)
Eos: 0 %
HEMOGLOBIN: 14.7 g/dL (ref 11.1–15.9)
Hematocrit: 44.3 % (ref 34.0–46.6)
IMMATURE GRANS (ABS): 0 10*3/uL (ref 0.0–0.1)
IMMATURE GRANULOCYTES: 0 %
LYMPHS: 34 %
Lymphocytes Absolute: 2.6 10*3/uL (ref 0.7–3.1)
MCH: 29.6 pg (ref 26.6–33.0)
MCHC: 33.2 g/dL (ref 31.5–35.7)
MCV: 89 fL (ref 79–97)
MONOCYTES: 6 %
Monocytes Absolute: 0.4 10*3/uL (ref 0.1–0.9)
NEUTROS ABS: 4.6 10*3/uL (ref 1.4–7.0)
NEUTROS PCT: 59 %
PLATELETS: 281 10*3/uL (ref 150–450)
RBC: 4.96 x10E6/uL (ref 3.77–5.28)
RDW: 13.5 % (ref 12.3–15.4)
WBC: 7.6 10*3/uL (ref 3.4–10.8)

## 2018-01-15 ENCOUNTER — Ambulatory Visit: Payer: 59 | Admitting: Family Medicine

## 2018-01-15 ENCOUNTER — Encounter: Payer: Self-pay | Admitting: Family Medicine

## 2018-01-15 ENCOUNTER — Other Ambulatory Visit: Payer: Self-pay

## 2018-01-15 VITALS — BP 116/82 | HR 94 | Temp 98.8°F | Resp 16 | Ht 67.72 in | Wt 170.0 lb

## 2018-01-15 DIAGNOSIS — R7302 Impaired glucose tolerance (oral): Secondary | ICD-10-CM

## 2018-01-15 DIAGNOSIS — R55 Syncope and collapse: Secondary | ICD-10-CM | POA: Diagnosis not present

## 2018-01-15 DIAGNOSIS — Z6826 Body mass index (BMI) 26.0-26.9, adult: Secondary | ICD-10-CM

## 2018-01-15 DIAGNOSIS — A6004 Herpesviral vulvovaginitis: Secondary | ICD-10-CM

## 2018-01-15 DIAGNOSIS — J301 Allergic rhinitis due to pollen: Secondary | ICD-10-CM

## 2018-01-15 DIAGNOSIS — I10 Essential (primary) hypertension: Secondary | ICD-10-CM | POA: Diagnosis not present

## 2018-01-15 DIAGNOSIS — E7800 Pure hypercholesterolemia, unspecified: Secondary | ICD-10-CM | POA: Diagnosis not present

## 2018-01-15 DIAGNOSIS — K5904 Chronic idiopathic constipation: Secondary | ICD-10-CM

## 2018-01-15 MED ORDER — GABAPENTIN 100 MG PO CAPS: ORAL_CAPSULE | ORAL | 1 refills | Status: DC

## 2018-01-15 MED ORDER — LINACLOTIDE 290 MCG PO CAPS: 290.0000 ug | ORAL_CAPSULE | Freq: Every day | ORAL | 3 refills | Status: DC

## 2018-01-15 MED ORDER — PHENTERMINE HCL 15 MG PO CAPS: 15.0000 mg | ORAL_CAPSULE | Freq: Every day | ORAL | 5 refills | Status: DC

## 2018-01-15 MED ORDER — OLMESARTAN MEDOXOMIL 20 MG PO TABS: 20.0000 mg | ORAL_TABLET | Freq: Every day | ORAL | 1 refills | Status: DC

## 2018-01-15 MED ORDER — TOPIRAMATE 25 MG PO TABS: 25.0000 mg | ORAL_TABLET | Freq: Two times a day (BID) | ORAL | 3 refills | Status: DC

## 2018-01-15 MED ORDER — ATORVASTATIN CALCIUM 20 MG PO TABS: 20.0000 mg | ORAL_TABLET | Freq: Every day | ORAL | 3 refills | Status: DC

## 2018-01-15 MED ORDER — OLMESARTAN MEDOXOMIL 20 MG PO TABS: 20.0000 mg | ORAL_TABLET | Freq: Every day | ORAL | 3 refills | Status: DC

## 2018-01-15 NOTE — Progress Notes (Signed)
Subjective:    Patient ID: Erin Peters, female    DOB: March 26, 1956, 62 y.o.   MRN: [id number]  01/15/2018  Hypertension (1 week follow-up ) and Near Syncope (pt states she feels better )    HPI This 62 y.o. female presents for evaluation of near syncopal sensations and hypertension.  Management changes made at last visit included having antihypertensive medication.  Urinalysis and lab work and EKG obtained at last visit as well as orthostatic pressures.  All results were normal.  Patient reports she is 75 to 85% improved since last visit. TOOK TWO DAYS BEFORE GETTING BACK TO NORMAL.  STILL FEELS A LITTLE LIGHTHEADED.  Readings are running normal 113-127/74-87. Will be moving to [place] in February 2019. Will be coming to  to visit. Moving February 2019.   BP Readings from Last 3 Encounters:  01/15/18 116/82  01/07/18 122/80  10/02/17 118/72   Wt Readings from Last 3 Encounters:  01/15/18 170 lb (77.1 kg)  01/07/18 169 lb 12.8 oz (77 kg)  10/02/17 170 lb 6.4 oz (77.3 kg)   Immunization History  Administered Date(s) Administered  . DTaP 07/09/2004  . Influenza Split 04/09/2015  . Influenza Whole 04/08/2013  . Influenza-Unspecified 04/08/2014, 04/08/2016, 04/08/2017  . Tdap 09/20/2016  . Zoster Recombinat (Shingrix) 12/21/2016, 04/05/2017    Review of Systems  Constitutional: Negative for activity change, appetite change, chills, diaphoresis, fatigue, fever and unexpected weight change.  HENT: Negative for congestion, dental problem, drooling, ear discharge, ear pain, facial swelling, hearing loss, mouth sores, nosebleeds, postnasal drip, rhinorrhea, sinus pressure, sneezing, sore throat, tinnitus, trouble swallowing and voice change.   Eyes: Negative for photophobia, pain, discharge, redness, itching and visual disturbance.  Respiratory: Negative for apnea, cough, choking, chest tightness, shortness of breath, wheezing and stridor.   Cardiovascular: Negative for  chest pain, palpitations and leg swelling.  Gastrointestinal: Negative for abdominal distention, abdominal pain, anal bleeding, blood in stool, constipation, diarrhea, nausea, rectal pain and vomiting.  Endocrine: Negative for cold intolerance, heat intolerance, polydipsia, polyphagia and polyuria.  Genitourinary: Negative for decreased urine volume, difficulty urinating, dyspareunia, dysuria, enuresis, flank pain, frequency, genital sores, hematuria, menstrual problem, pelvic pain, urgency, vaginal bleeding, vaginal discharge and vaginal pain.  Musculoskeletal: Negative for arthralgias, back pain, gait problem, joint swelling, myalgias, neck pain and neck stiffness.  Skin: Negative for color change, pallor, rash and wound.  Allergic/Immunologic: Negative for environmental allergies, food allergies and immunocompromised state.  Neurological: Negative for dizziness, tremors, seizures, syncope, facial asymmetry, speech difficulty, weakness, light-headedness, numbness and headaches.  Hematological: Negative for adenopathy. Does not bruise/bleed easily.  Psychiatric/Behavioral: Negative for agitation, behavioral problems, confusion, decreased concentration, dysphoric mood, hallucinations, self-injury, sleep disturbance and suicidal ideas. The patient is not nervous/anxious and is not hyperactive.     Past Medical History:  Diagnosis Date  . Arthritis    DDD lumbar  . Chronic kidney disease   . Constipation   . Hyperlipidemia   . Hypertension   . Migraine   . STD (sexually transmitted disease)    HSV   Past Surgical History:  Procedure Laterality Date  . ABDOMINAL HYSTERECTOMY     uterine prolapse; ovaries intact.  Marland Kitchen BREAST CYST ASPIRATION Right   . CHOLECYSTECTOMY    . HEMORRHOID SURGERY    . HERNIA REPAIR    . LUMBAR DISC SURGERY    . SPINE SURGERY    . STEROID INJECTION TO SCAR    . TONSILLECTOMY     No Known Allergies Current  Outpatient Medications on File Prior to Visit    Medication Sig Dispense Refill  . ALPRAZolam (XANAX) 0.5 MG tablet Take 1 tablet (0.5 mg total) by mouth daily as needed for anxiety. 10 tablet 0  . Estradiol 10 MCG TABS vaginal tablet Place 1 tablet (10 mcg total) vaginally 2 (two) times a week. 8 tablet 5  . etodolac (LODINE) 300 MG capsule     . magnesium oxide (MAG-OX) 400 MG tablet Take 400 mg by mouth daily.    . Multiple Vitamin (MULTIVITAMIN) tablet Take 1 tablet by mouth daily.    Marland Kitchen selenium sulfide (SELSUN) 2.5 % shampoo APPLY NECK TO KNEES OVERNIGHT, WASH OFF IN THE AM, THEN WAIT 1 WEEK AND REPEAT  3   No current facility-administered medications on file prior to visit.    Social History   Socioeconomic History  . Marital status: Married    Spouse name: Not on file  . Number of children: 3  . Years of education: Not on file  . Highest education level: Not on file  Occupational History  . 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 Financial resource strain: Not on file  . Food insecurity:    Worry: Not on file    Inability: Not on file  . Transportation needs:    Medical: Not on file    Non-medical: Not on file  Tobacco Use  . Smoking status: Former Research scientist (life sciences)  . Smokeless tobacco: Never Used  . Tobacco comment: not sure of year, she was in her 20's, she was not an everyday smoker   Substance and Sexual Activity  . Alcohol use: No    Alcohol/week: 0.0 oz  . Drug use: No  . Sexual activity: Yes    Birth control/protection: Post-menopausal, Surgical  Lifestyle  . Physical activity:    Days per week: Not on file    Minutes per session: Not on file  . Stress: Not on file  Relationships  . Social connections:    Talks on phone: Not on file    Gets together: Not on file    Attends religious service: Not on file    Active member of club or organization: Not on file    Attends meetings of clubs or organizations: Not on file    Relationship  status: Not on file  . Intimate partner violence:    Fear of current or ex partner: Not on file    Emotionally abused: Not on file    Physically abused: Not on file    Forced sexual activity: Not on file  Other Topics Concern  . Not on file  Social History Narrative   Marital status: married x 42 years; happily; no abuse      Children: 2 children (daughter 48, son 20); grandchildren (71)      Lives:  With husband.      Employment:  [employer] x 17 years;payment processing;  happy moderate.      Tobacco: none.      Alcohol: none; rare      Exercise:  Walking/treadmill/elliptical/weights gym at [place].  Goal is 5 days per week; usually 3 days per week.      Seatbelt:  100%; no texting      Guns: loaded and secured.               Family History  Problem Relation Age of Onset  .  Diabetes Mother   . Heart disease Mother 83       stents x 5; AMI age 11.  Marland Kitchen Hyperlipidemia Mother   . Hypertension Mother   . Dementia Mother   . Heart disease Maternal Grandmother   . Heart disease Maternal Grandfather        Objective:    BP 116/82   Pulse 94   Temp 98.8 F (37.1 C) (Oral)   Resp 16   Ht 5' 7.72" (1.72 m)   Wt 170 lb (77.1 kg)   SpO2 99%   BMI 26.06 kg/m  Physical Exam  Constitutional: She is oriented to person, place, and time. She appears well-developed and well-nourished. No distress.  HENT:  Head: Normocephalic and atraumatic.  Right Ear: External ear normal.  Left Ear: External ear normal.  Nose: Nose normal.  Mouth/Throat: Oropharynx is clear and moist.  Eyes: Pupils are equal, round, and reactive to light. Conjunctivae and EOM are normal.  Neck: Normal range of motion and full passive range of motion without pain. Neck supple. No JVD present. Carotid bruit is not present. No thyromegaly present.  Cardiovascular: Normal rate, regular rhythm and normal heart sounds. Exam reveals no gallop and no friction rub.  No murmur heard. Pulmonary/Chest: Effort normal  and breath sounds normal. She has no wheezes. She has no rales.  Abdominal: Soft. Bowel sounds are normal. She exhibits no distension and no mass. There is no tenderness. There is no rebound and no guarding.  Musculoskeletal:       Right shoulder: Normal.       Left shoulder: Normal.       Cervical back: Normal.  Lymphadenopathy:    She has no cervical adenopathy.  Neurological: She is alert and oriented to person, place, and time. She has normal reflexes. No cranial nerve deficit or sensory deficit. She exhibits normal muscle tone. Coordination normal.  Skin: Skin is warm and dry. No rash noted. She is not diaphoretic. No erythema. No pallor.  Psychiatric: She has a normal mood and affect. Her behavior is normal. Judgment and thought content normal.  Nursing note and vitals reviewed.  No results found. Depression screen [facility] 2/9 01/07/2018 10/02/2017 05/22/2017 04/29/2017 03/27/2017  Decreased Interest 0 0 0 0 0  Down, Depressed, Hopeless 0 0 0 0 0  PHQ - 2 Score 0 0 0 0 0   Fall Risk  01/07/2018 10/02/2017 05/22/2017 04/29/2017 03/27/2017  Falls in the past year? No No No No No  Number falls in past yr: - - - - -  Injury with [place]:   1. Near syncope   2. Essential hypertension   3. Pure hypercholesterolemia   4. Glucose intolerance (impaired glucose tolerance)   5. Acute seasonal allergic rhinitis due to pollen   6. Herpes simplex vulvovaginitis   7. Chronic idiopathic constipation   8. BMI 26.0-26.9,adult     Near syncope: 85% improved with decrease in hypertensive medication.  Recent work-up all within normal limits.  Hypertension: Well-controlled.  Will change hypertensive medication to Benicar 20 mg daily generic.  BMI of 26: Congratulations on weight loss.  Refills provided for Topamax and phentermine therapies.  Hypercholesterolemia: Controlled.  Refill provided.  Constipation: Stable at this time on Linzess  therapy.   No orders of the defined types were placed in this encounter.  Meds ordered this encounter  Medications  . DISCONTD: olmesartan (BENICAR) 20 MG tablet    Sig: Take 1 tablet (20 mg total) by mouth daily.    Dispense:  90 tablet    Refill:  1  . atorvastatin (LIPITOR) 20 MG tablet    Sig: Take 1 tablet (20 mg total) by mouth daily.    Dispense:  90 tablet    Refill:  3  . gabapentin (NEURONTIN) 100 MG capsule    Sig: TAKE 1 TO 3 CAPSULES BY MOUTH AT BEDTIME.    Dispense:  270 capsule    Refill:  1  . linaclotide (LINZESS) 290 MCG CAPS capsule    Sig: Take 1 capsule (290 mcg total) by mouth daily before breakfast.    Dispense:  90 capsule    Refill:  3  . olmesartan (BENICAR) 20 MG tablet    Sig: Take 1 tablet (20 mg total) by mouth daily.    Dispense:  90 tablet    Refill:  3  . topiramate (TOPAMAX) 25 MG tablet    Sig: Take 1 tablet (25 mg total) by mouth 2 (two) times daily.    Dispense:  180 tablet    Refill:  3  . phentermine 15 MG capsule    Sig: Take 1 capsule (15 mg total) by mouth daily.    Dispense:  30 capsule    Refill:  5    This request is for a new prescription for a controlled substance as required by Federal/State law..    No follow-ups on file.   Shirin Echeverry Elayne Guerin, M.D. Primary [facility] at [facility] [address] [place]  [phone] phone [phone] fax

## 2018-01-15 NOTE — Patient Instructions (Addendum)
   STOP OLMESARTAN-HCTZ 40-12.5. START OLMESARTAN 20MG  ONE DAILY.  Keep checking blood pressure regularly.   IF you received an x-ray today, you will receive an invoice from [facility]. Please contact [facility] at [phone] with questions or concerns regarding your invoice.   IF you received labwork today, you will receive an invoice from [facility]. Please contact [facility] at [phone] with questions or concerns regarding your invoice.   Our billing staff will not be able to assist you with questions regarding bills from these companies.  You will be contacted with the lab results as soon as they are available. The fastest way to get your results is to activate your My Chart account. Instructions are located on the last page of this paperwork. If you have not heard from [place] regarding the results in 2 weeks, please contact this office.

## 2018-04-02 ENCOUNTER — Ambulatory Visit: Payer: 59 | Admitting: Family Medicine

## 2018-05-05 DIAGNOSIS — M5136 Other intervertebral disc degeneration, lumbar region: Secondary | ICD-10-CM | POA: Diagnosis not present

## 2018-05-05 DIAGNOSIS — M5137 Other intervertebral disc degeneration, lumbosacral region: Secondary | ICD-10-CM | POA: Diagnosis not present

## 2018-06-18 IMAGING — CT CT HEAD W/O CM
4 series · 16 of 47 positions shown, 18 images · non-contrast
Comparison: None.

CLINICAL DATA: Headache for 1 week

EXAM:
CT HEAD WITHOUT CONTRAST
TECHNIQUE: Contiguous axial images were obtained from the base of the skull
through the vertex without intravenous contrast.

[Series 3: head without · axial · non-contrast · 0.44mm/px · z∈[+1404,+1524]mm · 7 of 32 slices shown, 9 images]
[im 4/32  brain]
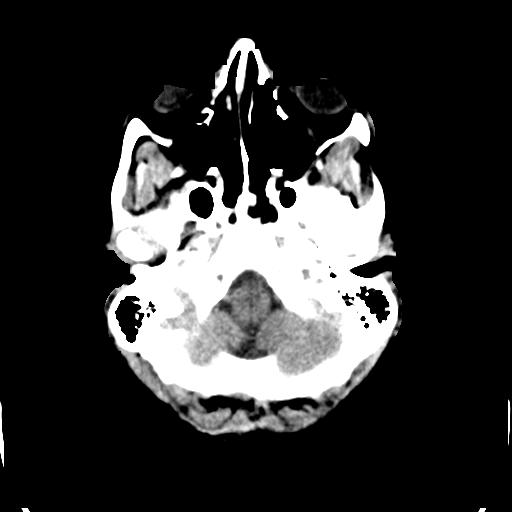
[im 4/32  bone]
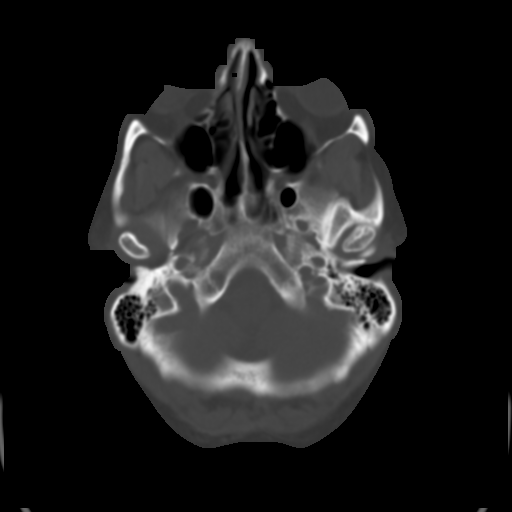
[im 8/32  brain]
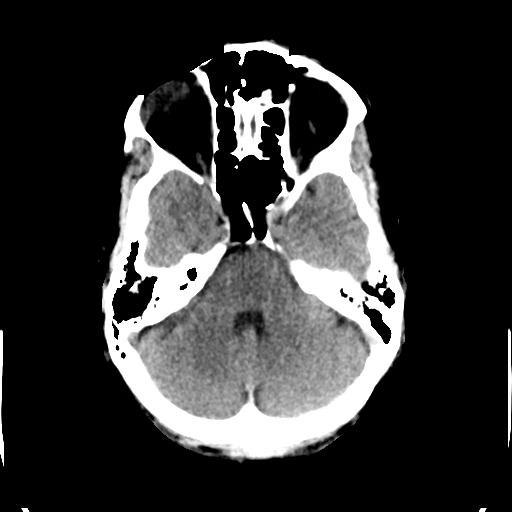
[im 12/32  brain]
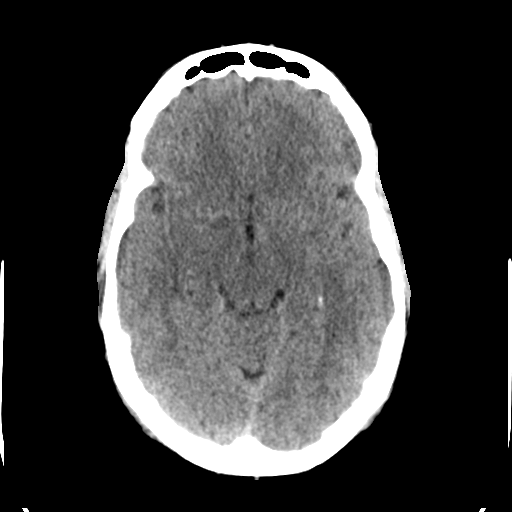
[im 16/32  brain]
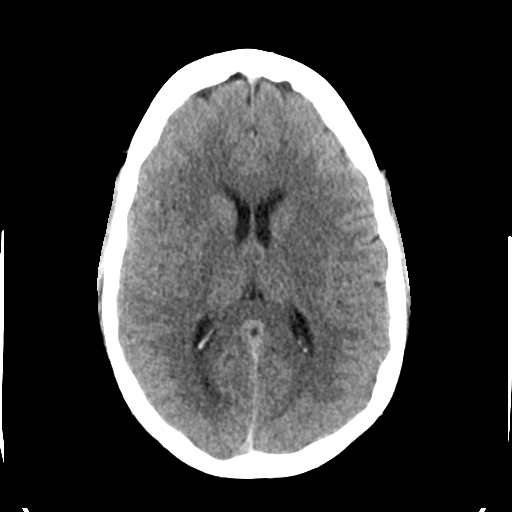
[im 20/32  brain]
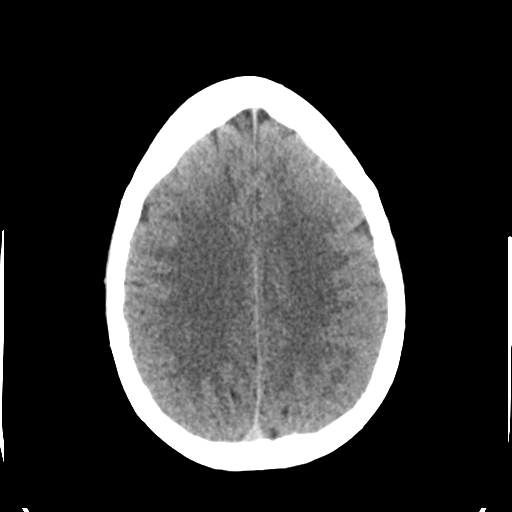
[im 20/32  bone]
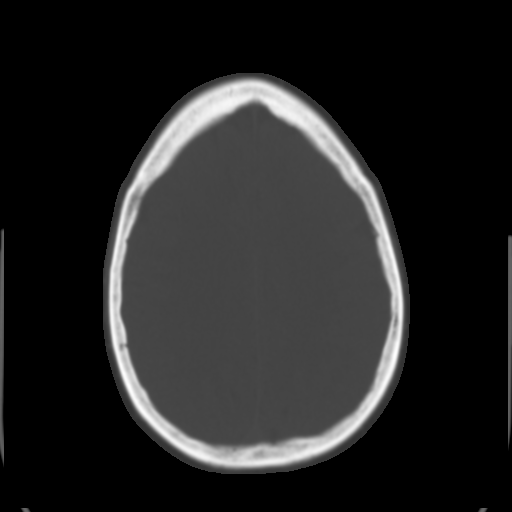
[im 24/32  brain]
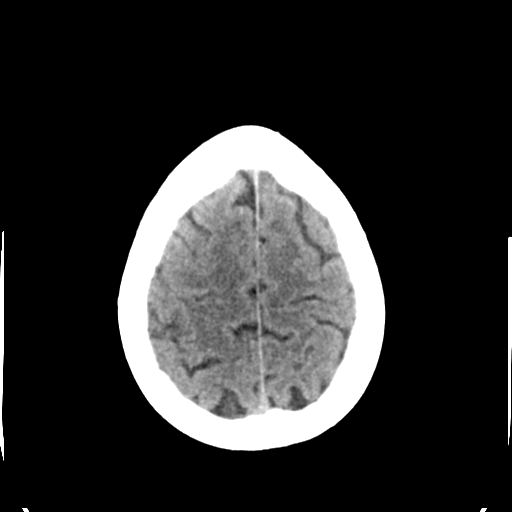
[im 28/32  brain]
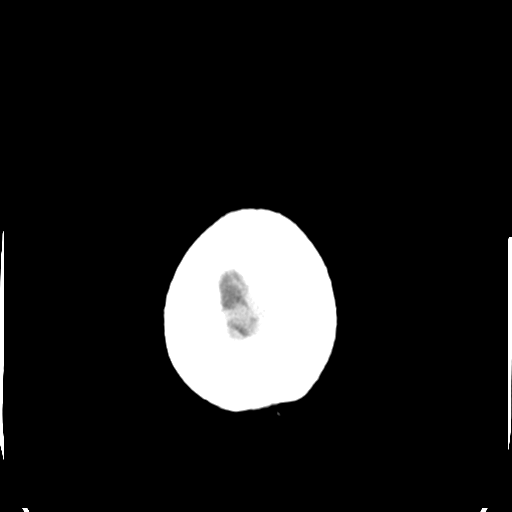

[Series 4: head bone · axial · 0.44mm/px · z∈[+1403,+1435]mm · 3 of 78 slices shown]
[im 8/78  bone]
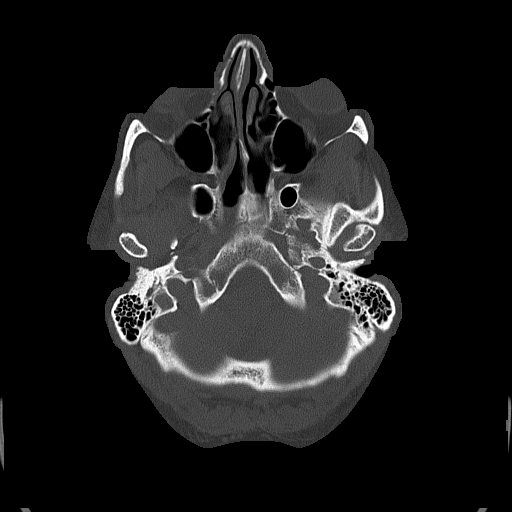
[im 16/78  bone]
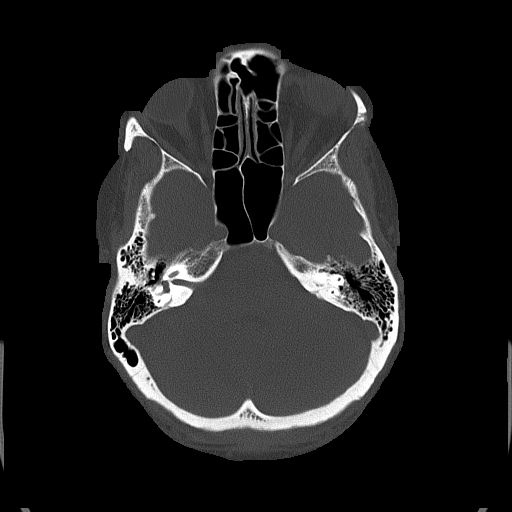
[im 24/78  bone]
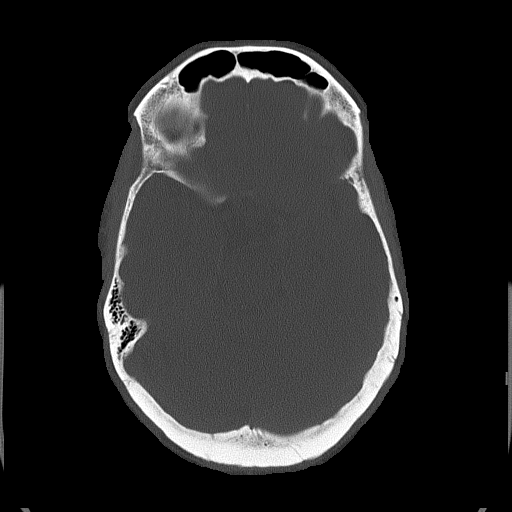

[Series 5: head without cor · coronal · non-contrast · 0.31mm/px · 3 of 68 slices shown]
[im 23/68  brain]
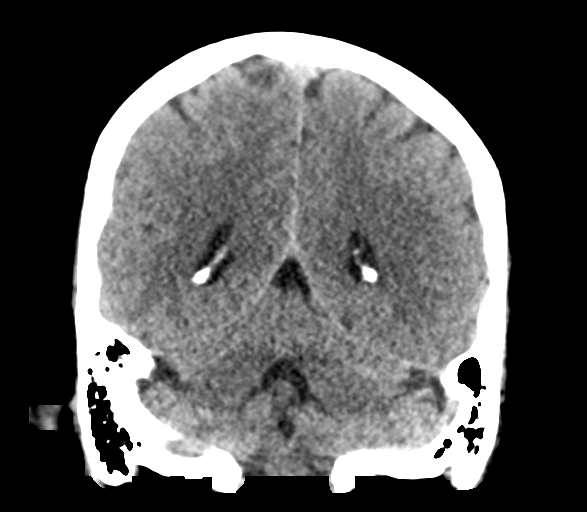
[im 30/68  brain]
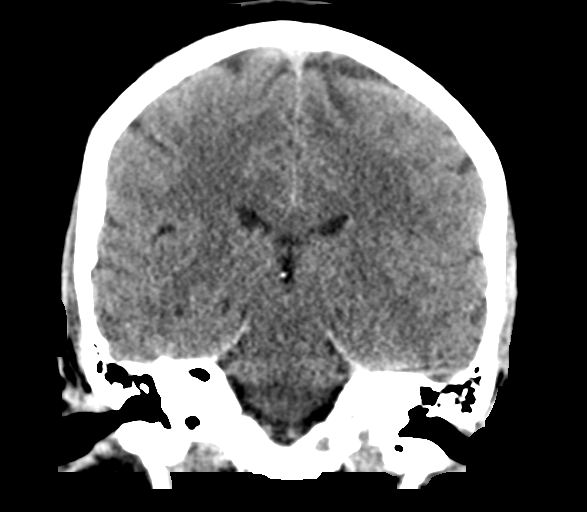
[im 38/68  brain]
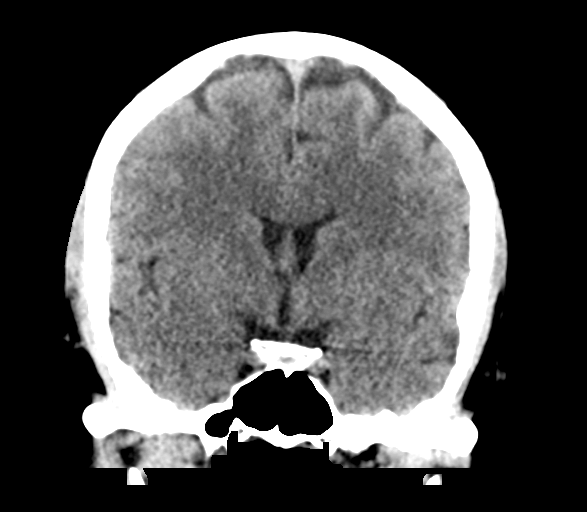

[Series 6: head without sag · sagittal · non-contrast · 0.31mm/px · 3 of 64 slices shown]
[im 22/64  brain]
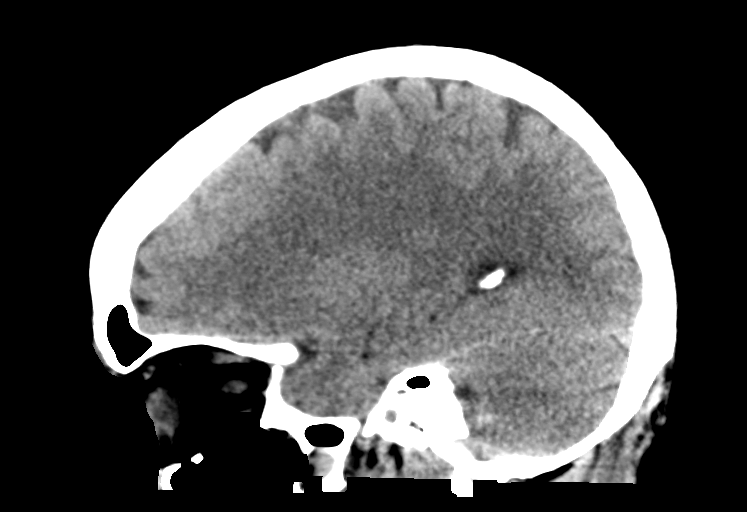
[im 32/64  brain]
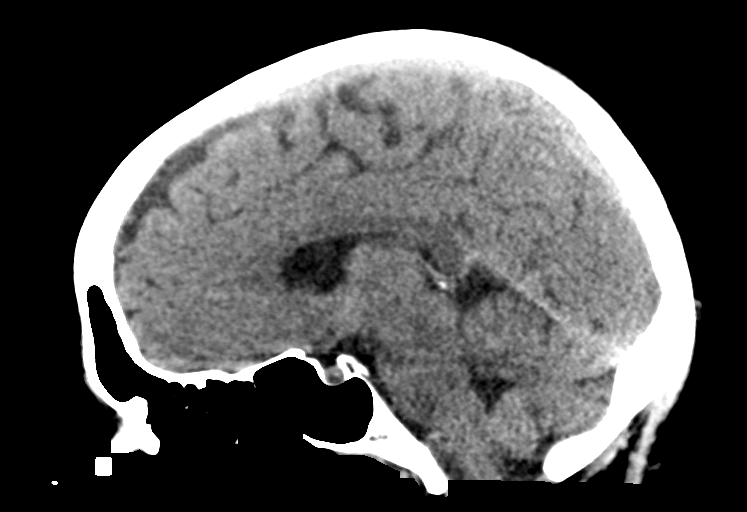
[im 43/64  brain]
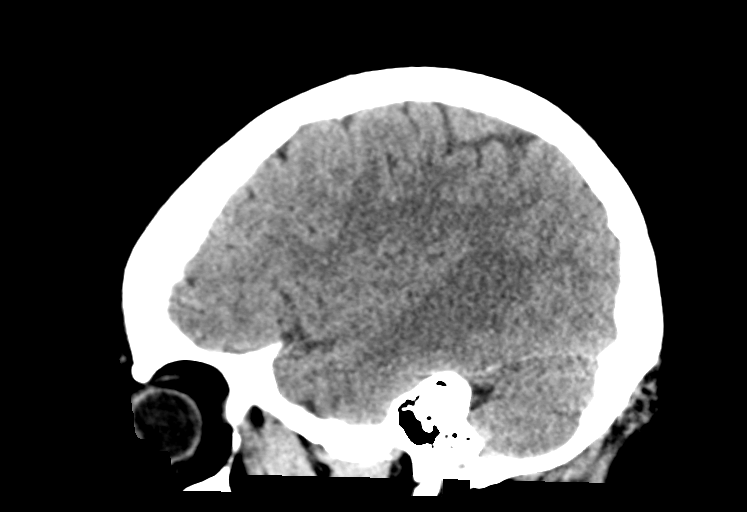

[16 of 47 positions shown; findings below may reference images not displayed]

FINDINGS: Brain: No evidence of acute infarction, hemorrhage, hydrocephalus,
extra-axial collection or mass lesion/mass effect.

Vascular: No hyperdense vessel or unexpected calcification.

Skull: Normal. Negative for fracture or focal lesion.

Sinuses/Orbits: No acute finding.

Other: None.
IMPRESSION: No acute intracranial pathology.

## 2018-06-26 DIAGNOSIS — Z6826 Body mass index (BMI) 26.0-26.9, adult: Secondary | ICD-10-CM | POA: Diagnosis not present

## 2018-07-22 ENCOUNTER — Encounter: Payer: 59 | Admitting: Family Medicine

## 2018-07-24 DIAGNOSIS — D2372 Other benign neoplasm of skin of left lower limb, including hip: Secondary | ICD-10-CM | POA: Diagnosis not present

## 2018-07-24 DIAGNOSIS — L820 Inflamed seborrheic keratosis: Secondary | ICD-10-CM | POA: Diagnosis not present

## 2018-08-11 ENCOUNTER — Ambulatory Visit: Payer: 59 | Admitting: Family Medicine

## 2018-08-11 ENCOUNTER — Encounter: Payer: Self-pay | Admitting: Family Medicine

## 2018-08-11 ENCOUNTER — Other Ambulatory Visit: Payer: Self-pay

## 2018-08-11 ENCOUNTER — Encounter: Payer: 59 | Admitting: Family Medicine

## 2018-08-11 VITALS — BP 128/80 | HR 95 | Temp 98.2°F | Ht 67.72 in | Wt 172.2 lb

## 2018-08-11 DIAGNOSIS — R7303 Prediabetes: Secondary | ICD-10-CM | POA: Diagnosis not present

## 2018-08-11 DIAGNOSIS — J310 Chronic rhinitis: Secondary | ICD-10-CM

## 2018-08-11 DIAGNOSIS — I10 Essential (primary) hypertension: Secondary | ICD-10-CM

## 2018-08-11 DIAGNOSIS — E7800 Pure hypercholesterolemia, unspecified: Secondary | ICD-10-CM | POA: Diagnosis not present

## 2018-08-11 LAB — LIPID PANEL
Chol/HDL Ratio: 4 ratio (ref 0.0–4.4)
Cholesterol, Total: 178 mg/dL (ref 100–199)
HDL: 45 mg/dL (ref >39)
LDL Calculated: 88 mg/dL (ref 0–99)
Triglycerides: 225 mg/dL — ABNORMAL HIGH (ref 0–149)
VLDL Cholesterol Cal: 45 mg/dL — ABNORMAL HIGH (ref 5–40)

## 2018-08-11 LAB — CMP14+EGFR
ALT: 37 IU/L — ABNORMAL HIGH (ref 0–32)
AST: 35 IU/L (ref 0–40)
Albumin/Globulin Ratio: 1.9 (ref 1.2–2.2)
Albumin: 4.8 g/dL (ref 3.8–4.8)
Alkaline Phosphatase: 101 IU/L (ref 39–117)
BUN/Creatinine Ratio: 18 (ref 12–28)
BUN: 12 mg/dL (ref 8–27)
Bilirubin Total: 0.4 mg/dL (ref 0.0–1.2)
CO2: 18 mmol/L — ABNORMAL LOW (ref 20–29)
Calcium: 9.7 mg/dL (ref 8.7–10.3)
Chloride: 108 mmol/L — ABNORMAL HIGH (ref 96–106)
Creatinine, Ser: 0.65 mg/dL (ref 0.57–1.00)
GFR calc Af Amer: 110 mL/min/{1.73_m2} (ref >59)
GFR calc non Af Amer: 95 mL/min/{1.73_m2} (ref >59)
Globulin, Total: 2.5 g/dL (ref 1.5–4.5)
Glucose: 88 mg/dL (ref 65–99)
Potassium: 5 mmol/L (ref 3.5–5.2)
Sodium: 143 mmol/L (ref 134–144)
Total Protein: 7.3 g/dL (ref 6.0–8.5)

## 2018-08-11 LAB — HEMOGLOBIN A1C
Est. average glucose Bld gHb Est-mCnc: 114 mg/dL
Hgb A1c MFr Bld: 5.6 % (ref 4.8–5.6)

## 2018-08-11 MED ORDER — FLUTICASONE PROPIONATE 50 MCG/ACT NA SUSP: 1.0000 | Freq: Two times a day (BID) | NASAL | 6 refills | Status: DC

## 2018-08-11 MED ORDER — ALPRAZOLAM 0.5 MG PO TABS: 0.5000 mg | ORAL_TABLET | Freq: Every day | ORAL | 0 refills | Status: AC | PRN

## 2018-08-11 MED ORDER — OLMESARTAN MEDOXOMIL 20 MG PO TABS: 20.0000 mg | ORAL_TABLET | Freq: Every day | ORAL | 3 refills | Status: DC

## 2018-08-11 NOTE — Patient Instructions (Signed)
Nonallergic Rhinitis Nonallergic rhinitis is a condition that causes symptoms that affect the nose, such as a runny nose and a stuffed-up nose (nasal congestion) that can make it hard to breathe through the nose. This condition is different from having an allergy (allergic rhinitis). Allergic rhinitis occurs when the body's defense system (immune system) reacts to a substance that you are allergic to (allergen), such as pollen, pet dander, mold, or dust. Nonallergic rhinitis has many similar symptoms, but it is not caused by allergens. Nonallergic rhinitis can be a short-term or long-term problem. What are the causes? This condition can be caused by many different things. Some common types of nonallergic rhinitis include: Infectious rhinitis  This is usually due to an infection in the upper respiratory tract. Vasomotor rhinitis  This is the most common type of long-term nonallergic rhinitis.  It is caused by too much blood flow through the nose, which makes the tissue inside of the nose swell.  Symptoms are often triggered by strong odors, cold air, stress, drinking alcohol, cigarette smoke, or changes in the weather. Occupational rhinitis  This type is caused by triggers in the workplace, such as chemicals, dusts, animal dander, or air pollution. Hormonal rhinitis  This type occurs in women as a result of an increase in the female hormone estrogen.  It may occur during pregnancy, puberty, and menstrual cycles.  Symptoms improve when estrogen levels drop. Drug-induced rhinitis Several drugs can cause nonallergic rhinitis, including:  Medicines that are used to treat high blood pressure, heart disease, and Parkinson disease.  Aspirin and NSAIDs.  Over-the-counter nasal decongestant sprays. These can cause a type of nonallergic rhinitis (rhinitis medicamentosa) when they are used for more than a few days. Nonallergic rhinitis with eosinophilia syndrome (NARES)  This type is caused by  having too much of a certain type of white blood cell (eosinophil). Nonallergic rhinitis can also be caused by a reaction to eating hot or spicy foods. This does not usually cause long-term symptoms. In some cases, the cause of nonallergic rhinitis is not known. What increases the risk? You are more likely to develop this condition if:  You are 30-60 years of age.  You are a woman. Women are twice as likely to have this condition. What are the signs or symptoms? Common symptoms of this condition include:  Nasal congestion.  Runny nose.  The feeling of mucus going down the back of the throat (postnasal drip).  Trouble sleeping at night and daytime sleepiness. Less common symptoms include:  Sneezing.  Coughing.  Itchy nose.  Bloodshot eyes. How is this diagnosed? This condition may be diagnosed based on:  Your symptoms and medical history.  A physical exam.  Allergy testing to rule out allergic rhinitis. You may have skin tests or blood tests. In some cases, the health care provider may take a swab of nasal secretions to look for an increased number of eosinophils. This would be done to confirm a diagnosis of NARES. How is this treated? Treatment for this condition depends on the cause. No single treatment works for everyone. Work with your health care provider to find the best treatment for you. Treatment may include:  Avoiding the things that trigger your symptoms.  Using medicines to relieve congestion, such as: ? Steroid nasal spray. There are many types. You may need to try a few to find out which one works best. ? Decongestant medicine. This may be an oral medicine or a nasal spray. These medicines are only used for   a short time.  Using medicines to relieve a runny nose. These may include antihistamine medicines or anticholinergic nasal sprays.  Surgery to remove tissue from inside the nose may be needed in severe cases if the condition has not improved after 6-12  months of medical treatment. Follow these instructions at home:  Take or use over-the-counter and prescription medicines only as told by your health care provider. Do not stop using your medicine even if you start to feel better.  Use salt-water (saline) rinses or other solutions (nasal washes or irrigations) to wash or rinse out the inside of your nose as told by your health care provider.  Do not take NSAIDs or medicines that contain aspirin if they make your symptoms worse.  Do not drink alcohol if it makes your symptoms worse.  Do not use any tobacco products, such as cigarettes, chewing tobacco, and e-cigarettes. If you need help quitting, ask your health care provider.  Avoid secondhand smoke.  Get some exercise every day. Exercise may help reduce symptoms of nonallergic rhinitis for some people. Ask your health care provider how much exercise and what types of exercise are safe for you.  Sleep with the head of your bed raised (elevated). This may reduce nighttime nasal congestion.  Keep all follow-up visits as told by your health care provider. This is important. Contact a health care provider if:  You have a fever.  Your symptoms are getting worse at home.  Your symptoms are not responding to medicine.  You develop new symptoms, especially a headache or nosebleed. This information is not intended to replace advice given to you by your health care provider. Make sure you discuss any questions you have with your health care provider. Document Released: 10/17/2015 Document Revised: 12/01/2015 Document Reviewed: 09/15/2015 Elsevier Interactive Patient Education  2019 [employer]  

## 2018-08-11 NOTE — Progress Notes (Signed)
2/3/20209:56 AM  Erin Peters 07/21/1955, 63 y.o. female [id number]  Chief Complaint  Patient presents with  . Medication Refill    HPI:   Patient is a 64 y.o. female with past medical history significant for HTN, HLP, hyperglycemia, elevated BMI, HSV, constipation who presents today for routine followup  Last OV with Dr Tamala Julian in July 2019 Currently taking benicar 18m once a day Doing really well on it BP meds, denies any more pre-syncope Doing really on topomax for weight loss, has lost about 30 lbs Recently retired [employer] hoping to exercise more Constipation well controlled with linezz Uses xanax only for travel Uses gabapentin for RLS  Has been having stuffy runny nose for about a month No fever or chills Taking OTC cold and sinus meds   Lab Results  Component Value Date   HGBA1C 5.9 (H) 10/02/2017   HGBA1C 5.6 09/20/2016   HGBA1C 5.6 03/20/2016   Lab Results  Component Value Date   MICROALBUR 0.2 12/21/2014   [place]81 10/02/2017   CREATININE 0.71 01/07/2018    Fall Risk  08/11/2018 01/07/2018 10/02/2017 05/22/2017 04/29/2017  Falls in the past year? 0 No No No No  Number falls in past yr: 0 - - - -  Injury with Fall? 1 - - - -  Comment - - - - -  Risk for fall due to : Impaired balance/gait - - - -     [facility] 08/11/2018 01/07/2018 10/02/2017  Decreased Interest 0 0 0  Down, Depressed, Hopeless 0 0 0  PHQ - 2 Score 0 0 0    No Known Allergies  Prior to Admission medications   Medication Sig Start Date End Date Taking? Authorizing Provider  ALPRAZolam (Duanne Moron 0.5 MG tablet Take 1 tablet (0.5 mg total) by mouth daily as needed for anxiety. 09/20/16  Yes SWardell Honour MD  atorvastatin (LIPITOR) 20 MG tablet Take 1 tablet (20 mg total) by mouth daily. 01/15/18  Yes SWardell Honour MD  etodolac (LODINE) 300 MG capsule  01/11/18  Yes [provider]  gabapentin (NEURONTIN) 100 MG capsule TAKE 1 TO 3 CAPSULES BY MOUTH AT  BEDTIME. 01/15/18  Yes SWardell Honour MD  linaclotide ([facility] 290 MCG CAPS capsule Take 1 capsule (290 mcg total) by mouth daily before breakfast. 01/15/18  Yes SWardell Honour MD  magnesium oxide (MAG-OX) 400 MG tablet Take 400 mg by mouth daily.   Yes [provider]  Multiple Vitamin (MULTIVITAMIN) tablet Take 1 tablet by mouth daily.   Yes [provider]  olmesartan (BENICAR) 20 MG tablet Take 1 tablet (20 mg total) by mouth daily. 01/15/18  Yes SWardell Honour MD  selenium sulfide (SELSUN) 2.5 % shampoo APPLY NECK TO KNEES OVERNIGHT, WASH OFF IN THE AM, THEN WAIT 1 WEEK AND REPEAT 12/25/17  Yes [provider]  topiramate (TOPAMAX) 25 MG tablet Take 1 tablet (25 mg total) by mouth 2 (two) times daily. 01/15/18  Yes SWardell Honour MD    Past Medical History:  Diagnosis Date  . Arthritis    DDD lumbar  . Chronic kidney disease   . Constipation   . Hyperlipidemia   . Hypertension   . Migraine   . STD (sexually transmitted disease)    HSV    Past Surgical History:  Procedure Laterality Date  . ABDOMINAL HYSTERECTOMY     uterine prolapse; ovaries intact.  .Marland KitchenBREAST CYST ASPIRATION Right   .  CHOLECYSTECTOMY    . HEMORRHOID SURGERY    . HERNIA REPAIR    . LUMBAR DISC SURGERY    . SPINE SURGERY    . STEROID INJECTION TO SCAR    . TONSILLECTOMY      Social History   Tobacco Use  . Smoking status: Former Research scientist (life sciences)  . Smokeless tobacco: Never Used  . Tobacco comment: not sure of year, she was in her 20's, she was not an everyday smoker   Substance Use Topics  . Alcohol use: No    Alcohol/week: 0.0 standard drinks    Family History  Problem Relation Age of Onset  . Diabetes Mother   . Heart disease Mother 47       stents x 5; AMI age 9.  Marland Kitchen Hyperlipidemia Mother   . Hypertension Mother   . Dementia Mother   . Heart disease Maternal Grandmother   . Heart disease Maternal Grandfather     Review of Systems  Constitutional: Negative for  chills and fever.  Respiratory: Negative for cough and shortness of breath.   Cardiovascular: Negative for chest pain, palpitations and leg swelling.  Gastrointestinal: Negative for abdominal pain, nausea and vomiting.    OBJECTIVE:  Blood pressure 128/80, pulse 95, temperature 98.2 F (36.8 C), temperature source Oral, height 5' 7.72" (1.72 m), weight 172 lb 3.2 oz (78.1 kg), SpO2 95 %. Body mass index is 26.4 kg/m.   Wt Readings from Last 3 Encounters:  08/11/18 172 lb 3.2 oz (78.1 kg)  01/15/18 170 lb (77.1 kg)  01/07/18 169 lb 12.8 oz (77 kg)    Physical Exam Vitals signs and nursing note reviewed.  Constitutional:      Appearance: She is well-developed.  HENT:     Head: Normocephalic and atraumatic.     Right Ear: Hearing, tympanic membrane, ear canal and external ear normal.     Left Ear: Hearing, tympanic membrane, ear canal and external ear normal.     Nose:     Right Turbinates: Swollen and pale.     Left Turbinates: Swollen and pale.  Eyes:     Conjunctiva/sclera: Conjunctivae normal.     Pupils: Pupils are equal, round, and reactive to light.  Neck:     Musculoskeletal: Neck supple.  Cardiovascular:     Rate and Rhythm: Normal rate and regular rhythm.     Heart sounds: Normal heart sounds. No murmur. No friction rub. No gallop.   Pulmonary:     Effort: Pulmonary effort is normal.     Breath sounds: Normal breath sounds. No wheezing or rales.  Lymphadenopathy:     Cervical: No cervical adenopathy.  Skin:    General: Skin is warm and dry.  Neurological:     Mental Status: She is alert and oriented to person, place, and time.     ASSESSMENT and PLAN  1. Essential hypertension Controlled. Continue current regime.  - Lipid panel - CMP14+EGFR  2. Pure hypercholesterolemia Checking labs today, medications will be adjusted as needed.  - Lipid panel - CMP14+EGFR  3. Prediabetes Checking labs. Cont with LFM - Hemoglobin A1c  4. Chronic  rhinitis Starting flonase. Patient educational handout given  Other orders - olmesartan (BENICAR) 20 MG tablet; Take 1 tablet (20 mg total) by mouth daily. - ALPRAZolam (XANAX) 0.5 MG tablet; Take 1 tablet (0.5 mg total) by mouth daily as needed for anxiety. - fluticasone (FLONASE) 50 MCG/ACT nasal spray; Place 1 spray into both nostrils 2 (two) times daily.   M Santiago, MD [facility] [address] [place], [place] [address] Ph.  [phone] Fax [phone]   

## 2018-08-27 ENCOUNTER — Other Ambulatory Visit: Payer: Self-pay

## 2018-08-27 MED ORDER — LINACLOTIDE 290 MCG PO CAPS: 290.0000 ug | ORAL_CAPSULE | Freq: Every day | ORAL | 1 refills | Status: DC

## 2018-09-15 ENCOUNTER — Other Ambulatory Visit: Payer: Self-pay | Admitting: Family Medicine

## 2018-09-15 MED ORDER — LINACLOTIDE 290 MCG PO CAPS: 290.0000 ug | ORAL_CAPSULE | Freq: Every day | ORAL | 1 refills | Status: DC

## 2018-09-15 NOTE — Telephone Encounter (Signed)
Copied from [place] [id number]. Topic: Quick Communication - See Telephone Encounter >> Sep 15, 2018  9:13 AM Loma Boston wrote: CRM for notification. See Telephone encounter for: 09/15/18.linaclotide (LINZESS) 290 MCG CAPS capsule 90 capsule 1 08/27/2018   Sig - Route: Take 1 capsule (290 mcg total) by mouth daily before breakfast. - Oral Pt is saying that [facility] is saying they have not received this current refill and to please resend.[place], [place] [facility] [place] [address] [place] [address] [place] [address] Phone: [phone] Fax: [phone]

## 2018-10-20 ENCOUNTER — Telehealth: Payer: Self-pay | Admitting: Family Medicine

## 2018-10-20 ENCOUNTER — Other Ambulatory Visit: Payer: Self-pay

## 2018-10-20 NOTE — Telephone Encounter (Signed)
Pt is needs  refills on Topomax Generic ,Lipitor Generic  and Linzess Pt is out of town in [place] . Please fill through mail order. Pt hopes to back in town in [place]. PT NUMBER IS [phone] '

## 2018-10-26 ENCOUNTER — Encounter: Payer: Self-pay | Admitting: Family Medicine

## 2018-10-29 ENCOUNTER — Encounter: Payer: 59 | Admitting: Family Medicine

## 2018-10-31 ENCOUNTER — Telehealth: Payer: Self-pay | Admitting: *Deleted

## 2018-10-31 ENCOUNTER — Other Ambulatory Visit: Payer: Self-pay | Admitting: *Deleted

## 2018-10-31 MED ORDER — ATORVASTATIN CALCIUM 20 MG PO TABS: 20.0000 mg | ORAL_TABLET | Freq: Every day | ORAL | 0 refills | Status: DC

## 2018-10-31 MED ORDER — TOPIRAMATE 25 MG PO TABS: 25.0000 mg | ORAL_TABLET | Freq: Two times a day (BID) | ORAL | 1 refills | Status: DC

## 2018-11-12 ENCOUNTER — Other Ambulatory Visit: Payer: Self-pay

## 2018-11-12 ENCOUNTER — Telehealth (INDEPENDENT_AMBULATORY_CARE_PROVIDER_SITE_OTHER): Payer: 59 | Admitting: Family Medicine

## 2018-11-12 DIAGNOSIS — M51369 Other intervertebral disc degeneration, lumbar region without mention of lumbar back pain or lower extremity pain: Secondary | ICD-10-CM | POA: Insufficient documentation

## 2018-11-12 DIAGNOSIS — I10 Essential (primary) hypertension: Secondary | ICD-10-CM

## 2018-11-12 DIAGNOSIS — R7302 Impaired glucose tolerance (oral): Secondary | ICD-10-CM

## 2018-11-12 DIAGNOSIS — E7800 Pure hypercholesterolemia, unspecified: Secondary | ICD-10-CM | POA: Diagnosis not present

## 2018-11-12 DIAGNOSIS — M5136 Other intervertebral disc degeneration, lumbar region: Secondary | ICD-10-CM

## 2018-11-12 DIAGNOSIS — G2581 Restless legs syndrome: Secondary | ICD-10-CM | POA: Insufficient documentation

## 2018-11-12 DIAGNOSIS — K5904 Chronic idiopathic constipation: Secondary | ICD-10-CM

## 2018-11-12 MED ORDER — ETODOLAC 300 MG PO CAPS: 300.0000 mg | ORAL_CAPSULE | Freq: Two times a day (BID) | ORAL | 0 refills | Status: AC

## 2018-11-12 MED ORDER — TOPIRAMATE 25 MG PO TABS: 25.0000 mg | ORAL_TABLET | Freq: Two times a day (BID) | ORAL | 1 refills | Status: AC

## 2018-11-12 MED ORDER — GABAPENTIN 100 MG PO CAPS: ORAL_CAPSULE | ORAL | 1 refills | Status: AC

## 2018-11-12 MED ORDER — OLMESARTAN MEDOXOMIL 20 MG PO TABS: 20.0000 mg | ORAL_TABLET | Freq: Every day | ORAL | 3 refills | Status: AC

## 2018-11-12 MED ORDER — ATORVASTATIN CALCIUM 20 MG PO TABS: 20.0000 mg | ORAL_TABLET | Freq: Every day | ORAL | 3 refills | Status: AC

## 2018-11-12 MED ORDER — LINACLOTIDE 290 MCG PO CAPS: 290.0000 ug | ORAL_CAPSULE | Freq: Every day | ORAL | 1 refills | Status: AC

## 2018-11-12 NOTE — Progress Notes (Signed)
Virtual Visit Note  I connected with patient on 11/12/18 at 1129 am  by phone and verified that I am speaking with the correct person using two identifiers. Erin Peters is currently located at home and patient is currently with them during visit. The provider, Rutherford Guys, MD is located in their office at time of visit.  I discussed the limitations, risks, security and privacy concerns of performing an evaluation and management service by telephone and the availability of in person appointments. I also discussed with the patient that there may be a patient responsible charge related to this service. The patient expressed understanding and agreed to proceed.   CC: medication refilled  HPI ? Patient is a 63 y.o. female with past medical history significant for HTN, HLP, prediabetes, elevated BMI, HSV, constipation who presents today for routine follow-up  Last OV Feb 2020 She has been stuck in [place] due to current stay at home orders Since last OV no changes in health She did receive my results note regarding last labs, has been working on LFM Constipation well controlled with linzess, denies any side effects Takes topomax for weight control, tolerates well, follows low card diet and exercises regularly Takes BP and cholesterol med as prescribed, denies any side effects RLS well controlled on gabapentin Takes etodoloac bid for DDD of lumbar spine, denies any side effects   Lab Results  Component Value Date   HGBA1C 5.6 08/11/2018   Lab Results  Component Value Date   CHOL 178 08/11/2018   HDL 45 08/11/2018   [place] 88 08/11/2018   TRIG 225 (H) 08/11/2018   CHOLHDL 4.0 08/11/2018    No Known Allergies  Prior to Admission medications   Medication Sig Start Date End Date Taking? Authorizing Provider  ALPRAZolam Duanne Moron) 0.5 MG tablet Take 1 tablet (0.5 mg total) by mouth daily as needed for anxiety. 08/11/18   Rutherford Guys, MD  atorvastatin (LIPITOR) 20 MG tablet  Take 1 tablet (20 mg total) by mouth daily. 10/31/18   Rutherford Guys, MD  etodolac (LODINE) 300 MG capsule  01/11/18   [provider]  fluticasone (FLONASE) 50 MCG/ACT nasal spray Place 1 spray into both nostrils 2 (two) times daily. 08/11/18   Rutherford Guys, MD  gabapentin (NEURONTIN) 100 MG capsule TAKE 1 TO 3 CAPSULES BY MOUTH AT BEDTIME. 01/15/18   Wardell Honour, MD  linaclotide [facility]) 290 MCG CAPS capsule Take 1 capsule (290 mcg total) by mouth daily before breakfast. 09/15/18   Rutherford Guys, MD  magnesium oxide (MAG-OX) 400 MG tablet Take 400 mg by mouth daily.    [provider]  Multiple Vitamin (MULTIVITAMIN) tablet Take 1 tablet by mouth daily.    [provider]  olmesartan (BENICAR) 20 MG tablet Take 1 tablet (20 mg total) by mouth daily. 08/11/18   Rutherford Guys, MD  selenium sulfide (SELSUN) 2.5 % shampoo APPLY NECK TO KNEES OVERNIGHT, Idamay OFF IN THE AM, THEN WAIT 1 WEEK AND REPEAT 12/25/17   [provider]  topiramate (TOPAMAX) 25 MG tablet Take 1 tablet (25 mg total) by mouth 2 (two) times daily. 10/31/18   Rutherford Guys, MD    Past Medical History:  Diagnosis Date  . Arthritis    DDD lumbar  . Chronic kidney disease   . Constipation   . Hyperlipidemia   . Hypertension   . Migraine   . STD (sexually transmitted disease)    HSV  Past Surgical History:  Procedure Laterality Date  . ABDOMINAL HYSTERECTOMY     uterine prolapse; ovaries intact.  Marland Kitchen BREAST CYST ASPIRATION Right   . CHOLECYSTECTOMY    . HEMORRHOID SURGERY    . HERNIA REPAIR    . LUMBAR DISC SURGERY    . SPINE SURGERY    . STEROID INJECTION TO SCAR    . TONSILLECTOMY      Social History   Tobacco Use  . Smoking status: Former Research scientist (life sciences)  . Smokeless tobacco: Never Used  . Tobacco comment: not sure of year, she was in her 20's, she was not an everyday smoker   Substance Use Topics  . Alcohol use: No    Alcohol/week: 0.0 standard drinks    Family  History  Problem Relation Age of Onset  . Diabetes Mother   . Heart disease Mother 56       stents x 5; AMI age 79.  Marland Kitchen Hyperlipidemia Mother   . Hypertension Mother   . Dementia Mother   . Heart disease Maternal Grandmother   . Heart disease Maternal Grandfather     Review of Systems  Constitutional: Negative for chills and fever.  Respiratory: Negative for cough and shortness of breath.   Cardiovascular: Negative for chest pain, palpitations and leg swelling.  Gastrointestinal: Negative for abdominal pain, nausea and vomiting.    Objective  Vitals as reported by the patient: none   ASSESSMENT and PLAN  1. Pure hypercholesterolemia 2. Glucose intolerance (impaired glucose tolerance) 3. DDD (degenerative disc disease), lumbar 4. Essential hypertension 5. RLS (restless legs syndrome) 6. Chronic idiopathic constipation  Chronic medical conditions well controlled on current regime. Medications refilled as requested. Continue working on [employer].  RTC precautions reviewed.   Other orders - atorvastatin (LIPITOR) 20 MG tablet; Take 1 tablet (20 mg total) by mouth daily. - etodolac (LODINE) 300 MG capsule; Take 1 capsule (300 mg total) by mouth 2 (two) times daily. - linaclotide (LINZESS) 290 MCG CAPS capsule; Take 1 capsule (290 mcg total) by mouth daily before breakfast. - olmesartan (BENICAR) 20 MG tablet; Take 1 tablet (20 mg total) by mouth daily. - topiramate (TOPAMAX) 25 MG tablet; Take 1 tablet (25 mg total) by mouth 2 (two) times daily. - gabapentin (NEURONTIN) 100 MG capsule; TAKE 1 TO 3 CAPSULES BY MOUTH AT BEDTIME.  FOLLOW-UP: 3 months   The above assessment and management plan was discussed with the patient. The patient verbalized understanding of and has agreed to the management plan. Patient is aware to call the clinic if symptoms persist or worsen. Patient is aware when to return to the clinic for a follow-up visit. Patient educated on when it is appropriate to go to  the emergency department.    I provided 13  minutes of non-face-to-face time during this encounter.  Rutherford Guys, MD [facility] at [place], [place] [address] Ph.  [phone] Fax [phone]

## 2018-11-12 NOTE — Addendum Note (Signed)
Addended by: Rutherford Guys on: 11/12/2018 11:44 AM   Modules accepted: Orders

## 2019-02-11 ENCOUNTER — Ambulatory Visit: Payer: 59

## 2019-02-16 ENCOUNTER — Ambulatory Visit: Payer: 59 | Admitting: Family Medicine

## 2020-03-30 ENCOUNTER — Telehealth: Payer: Self-pay | Admitting: *Deleted

## 2020-03-30 ENCOUNTER — Telehealth: Payer: Self-pay | Admitting: Family Medicine

## 2020-03-30 NOTE — Telephone Encounter (Signed)
Schedule mammogram mobile 9-27 [facility] at [facility]

## 2020-03-30 NOTE — Telephone Encounter (Signed)
This patient has moved / no longer lives in our area /
